# Patient Record
Sex: Male | Born: 1970 | Race: Black or African American | Hispanic: No | Marital: Single | Smoking: Current every day smoker
Health system: Southern US, Community
[De-identification: ages and names within clinical notes are randomized; demographics above are authoritative.]

## PROBLEM LIST (undated history)

## (undated) DIAGNOSIS — I10 Essential (primary) hypertension: Secondary | ICD-10-CM

---

## 2006-08-09 ENCOUNTER — Emergency Department: Payer: Self-pay | Admitting: Emergency Medicine

## 2013-05-02 ENCOUNTER — Inpatient Hospital Stay: Payer: Self-pay | Admitting: Internal Medicine

## 2013-05-02 LAB — CBC WITH DIFFERENTIAL/PLATELET
Basophil #: 0.1 10*3/uL (ref 0.0–0.1)
Basophil %: 0.8 %
Eosinophil #: 0 10*3/uL (ref 0.0–0.7)
Eosinophil %: 0 %
HCT: 41.6 % (ref 40.0–52.0)
HGB: 14.3 g/dL (ref 13.0–18.0)
Lymphocyte #: 0.5 10*3/uL — ABNORMAL LOW (ref 1.0–3.6)
Lymphocyte %: 2.6 %
MCH: 31.7 pg (ref 26.0–34.0)
MCHC: 34.3 g/dL (ref 32.0–36.0)
MCV: 93 fL (ref 80–100)
MONO ABS: 0.8 x10 3/mm (ref 0.2–1.0)
MONOS PCT: 4.2 %
Neutrophil #: 17.2 10*3/uL — ABNORMAL HIGH (ref 1.4–6.5)
Neutrophil %: 92.4 %
PLATELETS: 223 10*3/uL (ref 150–440)
RBC: 4.49 10*6/uL (ref 4.40–5.90)
RDW: 14.4 % (ref 11.5–14.5)
WBC: 18.6 10*3/uL — ABNORMAL HIGH (ref 3.8–10.6)

## 2013-05-02 LAB — URINALYSIS, COMPLETE
BILIRUBIN, UR: NEGATIVE
GLUCOSE, UR: NEGATIVE mg/dL (ref 0–75)
LEUKOCYTE ESTERASE: NEGATIVE
NITRITE: NEGATIVE
Ph: 5 (ref 4.5–8.0)
Protein: 100
RBC,UR: 3 /HPF (ref 0–5)
Specific Gravity: 1.027 (ref 1.003–1.030)

## 2013-05-02 LAB — COMPREHENSIVE METABOLIC PANEL
AST: 62 U/L — AB (ref 15–37)
Albumin: 3.6 g/dL (ref 3.4–5.0)
Alkaline Phosphatase: 143 U/L — ABNORMAL HIGH
Anion Gap: 7 (ref 7–16)
BUN: 13 mg/dL (ref 7–18)
Bilirubin,Total: 0.4 mg/dL (ref 0.2–1.0)
Calcium, Total: 9.6 mg/dL (ref 8.5–10.1)
Chloride: 97 mmol/L — ABNORMAL LOW (ref 98–107)
Co2: 29 mmol/L (ref 21–32)
Creatinine: 1.31 mg/dL — ABNORMAL HIGH (ref 0.60–1.30)
EGFR (African American): >60
EGFR (Non-African Amer.): >60
GLUCOSE: 116 mg/dL — AB (ref 65–99)
OSMOLALITY: 267 (ref 275–301)
POTASSIUM: 3.8 mmol/L (ref 3.5–5.1)
SGPT (ALT): 42 U/L (ref 12–78)
Sodium: 133 mmol/L — ABNORMAL LOW (ref 136–145)
TOTAL PROTEIN: 8.4 g/dL — AB (ref 6.4–8.2)

## 2013-05-02 LAB — TROPONIN I: Troponin-I: <0.02 ng/mL

## 2013-05-02 LAB — RAPID INFLUENZA A&B ANTIGENS

## 2013-05-03 LAB — CBC WITH DIFFERENTIAL/PLATELET
Basophil #: 0.1 10*3/uL (ref 0.0–0.1)
Basophil %: 0.5 %
Eosinophil #: 0 10*3/uL (ref 0.0–0.7)
Eosinophil %: 0 %
HCT: 37.8 % — AB (ref 40.0–52.0)
HGB: 13 g/dL (ref 13.0–18.0)
LYMPHS PCT: 4.7 %
Lymphocyte #: 0.6 10*3/uL — ABNORMAL LOW (ref 1.0–3.6)
MCH: 32.1 pg (ref 26.0–34.0)
MCHC: 34.3 g/dL (ref 32.0–36.0)
MCV: 93 fL (ref 80–100)
MONOS PCT: 3.9 %
Monocyte #: 0.5 x10 3/mm (ref 0.2–1.0)
NEUTROS ABS: 12.5 10*3/uL — AB (ref 1.4–6.5)
Neutrophil %: 90.9 %
PLATELETS: 221 10*3/uL (ref 150–440)
RBC: 4.05 10*6/uL — AB (ref 4.40–5.90)
RDW: 14.7 % — AB (ref 11.5–14.5)
WBC: 13.8 10*3/uL — ABNORMAL HIGH (ref 3.8–10.6)

## 2013-05-03 LAB — BASIC METABOLIC PANEL
Anion Gap: 7 (ref 7–16)
BUN: 13 mg/dL (ref 7–18)
CALCIUM: 8.4 mg/dL — AB (ref 8.5–10.1)
CO2: 24 mmol/L (ref 21–32)
Chloride: 104 mmol/L (ref 98–107)
Creatinine: 1.12 mg/dL (ref 0.60–1.30)
EGFR (Non-African Amer.): >60
Glucose: 130 mg/dL — ABNORMAL HIGH (ref 65–99)
OSMOLALITY: 272 (ref 275–301)
Potassium: 3.5 mmol/L (ref 3.5–5.1)
Sodium: 135 mmol/L — ABNORMAL LOW (ref 136–145)

## 2013-05-03 LAB — TROPONIN I: Troponin-I: <0.02 ng/mL

## 2013-05-03 LAB — CK-MB: CK-MB: 16.6 ng/mL — ABNORMAL HIGH (ref 0.5–3.6)

## 2013-05-07 LAB — CULTURE, BLOOD (SINGLE)

## 2013-05-08 LAB — CULTURE, BLOOD (SINGLE)

## 2013-08-10 ENCOUNTER — Observation Stay: Payer: Self-pay | Admitting: Surgery

## 2013-08-10 LAB — CBC WITH DIFFERENTIAL/PLATELET
BASOS ABS: 0.1 10*3/uL (ref 0.0–0.1)
Basophil %: 0.4 %
Eosinophil #: 0.1 10*3/uL (ref 0.0–0.7)
Eosinophil %: 0.6 %
HCT: 43.9 % (ref 40.0–52.0)
HGB: 14.7 g/dL (ref 13.0–18.0)
Lymphocyte #: 1.3 10*3/uL (ref 1.0–3.6)
Lymphocyte %: 5.7 %
MCH: 31 pg (ref 26.0–34.0)
MCHC: 33.4 g/dL (ref 32.0–36.0)
MCV: 93 fL (ref 80–100)
Monocyte #: 1.7 x10 3/mm — ABNORMAL HIGH (ref 0.2–1.0)
Monocyte %: 7.8 %
NEUTROS ABS: 19 10*3/uL — AB (ref 1.4–6.5)
Neutrophil %: 85.5 %
PLATELETS: 416 10*3/uL (ref 150–440)
RBC: 4.74 10*6/uL (ref 4.40–5.90)
RDW: 14.3 % (ref 11.5–14.5)
WBC: 22.2 10*3/uL — AB (ref 3.8–10.6)

## 2013-08-10 LAB — BASIC METABOLIC PANEL
Anion Gap: 7 (ref 7–16)
BUN: 7 mg/dL (ref 7–18)
CHLORIDE: 99 mmol/L (ref 98–107)
Calcium, Total: 9.7 mg/dL (ref 8.5–10.1)
Co2: 28 mmol/L (ref 21–32)
Creatinine: 1.03 mg/dL (ref 0.60–1.30)
EGFR (African American): >60
EGFR (Non-African Amer.): >60
GLUCOSE: 98 mg/dL (ref 65–99)
Osmolality: 266 (ref 275–301)
POTASSIUM: 3.4 mmol/L — AB (ref 3.5–5.1)
SODIUM: 134 mmol/L — AB (ref 136–145)

## 2013-08-15 LAB — WOUND CULTURE

## 2013-08-15 LAB — CULTURE, BLOOD (SINGLE)

## 2013-08-17 LAB — WOUND CULTURE

## 2014-06-02 NOTE — H&P (Signed)
PATIENT NAME:  Thomas Gonzales, Thomas Gonzales MR#:  [id number] DATE OF BIRTH:  [birth year]  DATE OF ADMISSION:  08/10/2013  PRIMARY CARE PHYSICIAN:  Dr. Toy Cookey.   CHIEF COMPLAINT:  Rectal pain.   BRIEF HISTORY OF PRESENT ILLNESS:  Thomas Gonzales is a 44 year old gentleman seen in the Emergency Room with a 12 to 18 hour history of pain around his rectum.  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  One of his employers had had similar problems with a rectal abscess in the past and suggested he get it checked out if his symptoms persisted.  They got worse over the course of the day, came to the Emergency Room tonight for further evaluation.  He was noted to have some induration around his rectum.  His white blood cell count was 22,000.  CT scan was performed which demonstrated a 6 x 6 abscess which appeared to be below the levator muscles.  The surgical service was consulted.   He denies any similar or previous problems.  Denies any previous stomach problems.  His only previous surgery was right inguinal hernia repair at age 61 in an open fashion.  He has history of hypertension and asthma.  He was admitted in March of 2015 by the medical service for his symptomatic asthma, was treated with a steroid taper.  He is a cigarette smoker and continues to smoke.  He also has a history of hypertension on single drug therapy.  He has no cardiac disease or diabetes.  Denies history of hepatitis, yellow jaundice, pancreatitis, peptic ulcer disease, gallbladder disease or diverticulitis.  He is an intermittent drinker, primarily on weekends.  He is accompanied by his mother.    ALLERGIES:  He has no medical allergies.   CURRENT MEDICATIONS:  Include albuterol inhaler and amlodipine 5 mg once a day.   REVIEW OF SYSTEMS:  Otherwise unremarkable.  A 10-point review of systems was carried out with the patient and is otherwise negative.   PHYSICAL EXAMINATION: GENERAL:  He  is an alert, pleasant, comfortable, polite gentleman in no significant distress.  VITALS:  Blood pressure 160/80.  Heart rate is 92 and regular.  He is febrile at 99.  HEENT:  Unremarkable.  He has no scleral icterus.  No pupillary abnormalities.  NECK:  Supple, nontender with midline trachea.  CHEST:  Has some bilateral expiratory wheezes and some tubular sounds bilaterally.  The tubular sounds do clear with coughing.  He does appear a bit tight on examination.  CARDIAC:  Reveals no murmurs or gallops.  He seems to be in normal sinus rhythm.  ABDOMEN:  Benign with no abdominal discomfort, no point tenderness, no rebound, no guarding.  RECTAL:  Indurated area on the right side.  I did not do a digital examination.  The induration extends from posterior on his buttocks up to just below his scrotum.  There is not an obvious fluctuant area.  EXTREMITIES:  Lower extremity exam reveals full range of motion.  No deformities.  PSYCHIATRIC:  Normal orientation, normal affect.   IMPRESSION:  I have independently reviewed his CT scan.  He does appear to have a multiloculated fluid collection consistent with an abscess in the perianal, sub-levator area.   We will plan to admit him to the hospital, rehydrate him, put him on antibiotics, keep him nothing by mouth and arrange for surgical intervention this morning.  This plan has been outlined to the patient in detail  and he is in agreement.    ____________________________ Carmie Endalph L. Ely III, MD rle:ea D: 08/10/2013 03:52:28 ET T: 08/10/2013 05:33:16 ET JOB#: [id number]  cc: Carmie Endalph L. Ely III, MD, <Dictator> Serita ShellerErnest B. Maryellen PileEason, MD Quentin OreALPH L ELY MD ELECTRONICALLY SIGNED 08/10/2013 20:33

## 2014-06-02 NOTE — Discharge Summary (Signed)
PATIENT NAME:  Thomas Gonzales, Godson MR#:  [id number] DATE OF BIRTH:  09-24-1970  DATE OF ADMISSION:  05/02/2013 DATE OF DISCHARGE:  05/04/2013  ADMITTING PHYSICIAN: Dr. Heron NayVasireddy.  DISCHARGING PHYSICIAN: Enid Baasadhika Vipul Cafarelli, MD.  PRIMARY CARE PHYSICIAN: Dr. Maryellen PileEason.   CONSULTATIONS IN HOSPITAL: None.   DISCHARGE DIAGNOSES:  1.  Acute asthma exacerbation.  2.  Bilateral pneumonia.  3.  Influenza positive.  4.  Pleuritic chest pain secondary to pneumonia.  5.  Hypertension.   DISCHARGE HOME MEDICATIONS:  1.  Norvasc 5 mg p.o. daily.  2.  Levaquin 750 mg p.o. daily for 7 more days.  3.  Combivent inhaler 1 puff four times a day.  4.  Prednisone taper over 6 days.  5.  Tamiflu 75 mg p.o. b.i.d. to finish off the course.   DISCHARGE DIET: Low-sodium diet.   DISCHARGE ACTIVITY: As tolerated.   FOLLOWUP INSTRUCTIONS: PCP followup in 1 to 2 weeks.   LABORATORIES AND IMAGING STUDIES PRIOR TO DISCHARGE: Chest x-ray showing patchy consolidation, medial right lung base and left perihilar regions. No pleural effusion.   Blood cultures remained negative.   WBC 13.8, hemoglobin 13.3, hematocrit 37.8, platelet count 221.   Sodium 135, potassium 3.5, chloride 104, bicarb 24, BUN 13, creatinine 1.12, glucose 130, and calcium of 8.4.   Influenza test was positive for influenza B antigen.   Urinalysis negative for any infection.   BRIEF HOSPITAL COURSE: Thomas Gonzales is a healthy 44 year old African American male with past medical history significant for asthma who presents with significant difficulty breathing, hypoxia, and cough. Noted to have bilateral pneumonia from chest x-ray with elevated white count and also positive influenza B antigen test.   Asthma exacerbation with bilateral pneumonias associated with influenza positivity. Started Tamiflu. Was given steroid nebulizer treatments and also on IV antibiotics. The patient had intense pleuritic chest pain initially and was spiking high-grade fevers in  the first 24 hours. All of this settled down and his oxygen requirements have improved. He was on 3 L oxygen initially and he was weaned down to room air and is breathing comfortably. His cough has improved significantly along with the wheezing. The patient feels good, ambulated, and his sats did not drop, and he feels ready to go home.   DISCHARGE MEDICATIONS: He is being discharged on inhalers, prednisone taper, antibiotics, and Tamiflu to finish up the course.   His course has been otherwise uneventful in the hospital, advised to quit smoking.   DISCHARGE CONDITION: Stable.   DISCHARGE DISPOSITION: Home.   TIME SPENT ON DISCHARGE: 40 minutes.   ____________________________  Enid Baasadhika Rosamary Boudreau, MD rk:np D: 05/04/2013 15:11:44 ET T: 05/04/2013 17:50:42 ET JOB#: [id number]  cc: Enid Baasadhika Tanique Matney, MD, <Dictator> Serita ShellerErnest B. Maryellen PileEason, MD  Enid BaasADHIKA Machele Deihl MD ELECTRONICALLY SIGNED 05/18/2013 13:50

## 2014-06-02 NOTE — Discharge Summary (Signed)
PATIENT NAME:  Thomas Gonzales, Thomas Gonzales MR#:  [id number] DATE OF BIRTH:  01-Oct-1970  DATE OF ADMISSION:  08/10/2013 DATE OF DISCHARGE:  08/11/2013  DIAGNOSES: Perirectal abscess, hypertension and asthma.   PROCEDURE: Incision and drainage of a perirectal abscess of the right buttock with drain placement.   HISTORY OF PRESENT ILLNESS AND HOSPITAL COURSE: This is a patient with a perirectal abscess. Dr. Michela PitcherEly admitted the patient from the Emergency Room. He was taken to the Operating Room where drainage was performed. Cultures are currently pending. Penrose drains are in place, a total of 2, and he is much improved, tolerating a regular diet. He will be discharged in stable condition with the drains in place to follow up in my office on Wednesday in [place]. He is instructed to call on Monday for an appointment. He is given Bactrim and Flagyl and oral analgesics, and follow-up in my office.    ____________________________ Adah Salvageichard E. Excell Seltzerooper, MD rec:ts D: 08/11/2013 09:30:01 ET T: 08/11/2013 19:26:33 ET JOB#: [id number]  cc: Adah Salvageichard E. Excell Seltzerooper, MD, <Dictator> Lattie HawICHARD E COOPER MD ELECTRONICALLY SIGNED 08/12/2013 9:11

## 2014-06-02 NOTE — H&P (Signed)
PATIENT NAME:  Thomas Gonzales, Thomas Gonzales MR#:  [id number] DATE OF BIRTH:  Jan 19, 1971  DATE OF ADMISSION:  05/02/2013  PRIMARY CARE PHYSICIAN: Dr. Toy CookeyErnest Eason.   REFERRING PHYSICIAN: Dr. Loleta Roseory Forbach.    CHIEF COMPLAINT: Cough, shortness of breath.   HISTORY OF PRESENT ILLNESS: Mr. Thomas Gonzales is a 44 year old male with history of asthma, presented to the Emergency Department with complaints of cough, shortness of breath for the last 2 days. It started on Sunday with a cough which progressed to productive sputum, subjective fevers. Has been coughing productive yellow sputum. Concerning this, came to the Emergency Department. Workup in the Emergency Department: Influenza B is positive. Chest x-ray did not show any obvious infiltrates. The patient is in moderate distress secondary to shortness of breath. He is able to speak full sentences. The patient usually takes inhalers with improvement in his symptoms; however, did not get any relief from multiple inhalers.   PAST MEDICAL HISTORY:  1. History of asthma.  2. Hypertension.   PAST SURGICAL HISTORY: Hernia repair.   ALLERGIES: No known drug allergies.   HOME MEDICATIONS: Inhalers.    SOCIAL HISTORY: Continues to smoke 1 pack a day. Drinks alcohol 1 pint over the weekend. Denies using any illicit drugs. Works in [employer].   FAMILY HISTORY: Diabetes mellitus. Grandmother had leukemia.   REVIEW OF SYSTEMS:  CONSTITUTIONAL: Generalized weakness.  EYES: No change in vision.  EARS, NOSE, THROAT: Has a cough throat, sore throat.  RESPIRATORY: Has cough, shortness of breath.  CARDIOVASCULAR: No chest pain, palpations.  GASTROINTESTINAL: No nausea, vomiting, abdominal pain.  GENITOURINARY: No dysuria or hematuria.  ENDOCRINE: No polyuria or polydipsia.  HEMATOLOGIC: No easy bruising or bleeding.  SKIN: No rash or lesions.  MUSCULOSKELETAL: Generalized body aches.  NEUROLOGIC: No  or numbness in any part of the body.   PHYSICAL EXAMINATION:  GENERAL:  This is a well-built, well-nourished, age-appropriate male lying down in the bed, in mild distress.  VITAL SIGNS: Temperature 100.3, pulse 117, blood pressure 138/67, respiratory rate of 30, oxygen saturation is 94% on 2 liters of oxygen.  HEENT: Head normocephalic, atraumatic. There is no scleral icterus. Conjunctivae normal. Pupils equal and react to light. Extraocular movements are intact. Mucous membranes moist. Mild erythema of the pharynx.  NECK: Supple. No lymphadenopathy. No JVD. No carotid bruit. No thyromegaly.  CHEST: Has no focal tenderness. Bilateral diffuse wheezing and rhonchi.  HEART: S1, S2, regular, tachycardia. No pedal edema. Pulses 2+.  ABDOMEN: Bowel sounds present. Soft, mild tenderness. No hepatosplenomegaly.  SKIN: No rash or lesions.  MUSCULOSKELETAL: Good range of motion in all of the extremities.  NEUROLOGIC: The patient is alert, oriented to place, person and time. Cranial nerves II through XII intact. Motor 5/5 in upper and lower extremities.   LABORATORIES: CBC: WBC of 18.6, hemoglobin 14.3, platelet count of 223.   CMP is completely within normal limits.   Troponin less than 0.02.   UA negative for nitrites and leukocyte esterase. Influenza B is positive. ABG: PH of 7.41, pCO2 of 39, PaO2 of 74.   ASSESSMENT AND PLAN: Mr. Thomas Gonzales is a 44 year old male with known history of asthma. Comes with asthma exacerbation. The patient also has history of smoking.  1. Asthma/chronic obstructive pulmonary disease exacerbation: Continue with breathing treatments, Solu-Medrol and antibiotics.  2. Pneumonia: Even though chest x-ray does not show any obvious infiltrates, very highly concerning about pneumonia, ill looking. Considering the patient's influenza B, will also add vancomycin to the regimen. Continue with Rocephin and Zithromax.  Will also start the patient on Tamiflu. The patient is within 72 hour window.  3. Tobacco use: Counseled with the patient regarding quitting.   4. Hypertension: Currently well controlled.  5. Sinus tachycardia: Secondary to viral illness. Continue with intravenous fluids and follow up. Treat the underlying asthma exacerbation.  6. Keep the patient on deep vein thrombosis prophylaxis with Lovenox.   TIME SPENT: 50 minutes.   ____________________________ Susa Griffins, MD pv:gb D: 05/02/2013 23:48:18 ET T: 05/03/2013 00:53:39 ET JOB#: [id number]  cc: Susa Griffins, MD, <Dictator> Serita Sheller. Maryellen Pile, MD Susa Griffins MD ELECTRONICALLY SIGNED 05/25/2013 0:17

## 2014-06-02 NOTE — Op Note (Signed)
PATIENT NAME:  Thomas Gonzales, Thomas Gonzales MR#:  [id number] DATE OF BIRTH:  1970/12/30  DATE OF PROCEDURE:  08/10/2013  PREOPERATIVE DIAGNOSIS: Perirectal abscess.   POSTOPERATIVE DIAGNOSIS:  Perirectal abscess.  PROCEDURE: Incision and drainage of perirectal abscess.   SURGEON: Dionne Miloichard Maceo Hernan, M.D.   ANESTHESIA: General with LMA.  ASSISTANT: Baird CancerIsaac Stappaspas.   INDICATIONS: This is a patient with progressive pain and swelling signifying a perirectal abscess. Preoperatively, we discussed rationale for surgery, the options of observation, risk of bleeding, infection, recurrence, open wound. This was all reviewed for him. He understood and agreed to proceed.   FINDINGS: Large abscess cavity, 2 incisions made with drains placed.   DESCRIPTION OF PROCEDURE: The patient was induced to general anesthesia. He was on IV antibiotics. He was placed in a high lithotomy position and then prepped and draped in a sterile fashion.   Exam under anesthesia was performed identifying an area of induration on the right buttock. An incision was made first anteriorly with some serous fluid, which was cultured, and then a second incision was made posterior to this where a large amount of purulence exuded.  These 2 incisions were connected and, after aspirating out all the purulence, 2 piece of Penrose drain were placed.  One connecting the two separate incisions and 1 laid deep into the large cavity. These were tied in with 3-0 nylon. Sterile dressings were placed including ABDs and burn panties. The patient tolerated the procedure well. There were no complications. He was taken to the recovery room in stable condition to be admitted for continued care.    ____________________________ Adah Salvageichard E. Excell Seltzerooper, MD rec:ts D: 08/10/2013 12:41:57 ET T: 08/10/2013 19:05:41 ET JOB#: [id number]  cc: Adah Salvageichard E. Excell Seltzerooper, MD, <Dictator> Lattie HawICHARD E Yvonne Stopher MD ELECTRONICALLY SIGNED 08/11/2013 10:43

## 2015-03-07 IMAGING — CR DG CHEST 1V PORT
1 series · 1 of 1 positions shown · non-contrast
Comparison: None.

CLINICAL DATA: Cough and fever.  Tachycardia

EXAM:
PORTABLE CHEST - 1 VIEW

[ap]
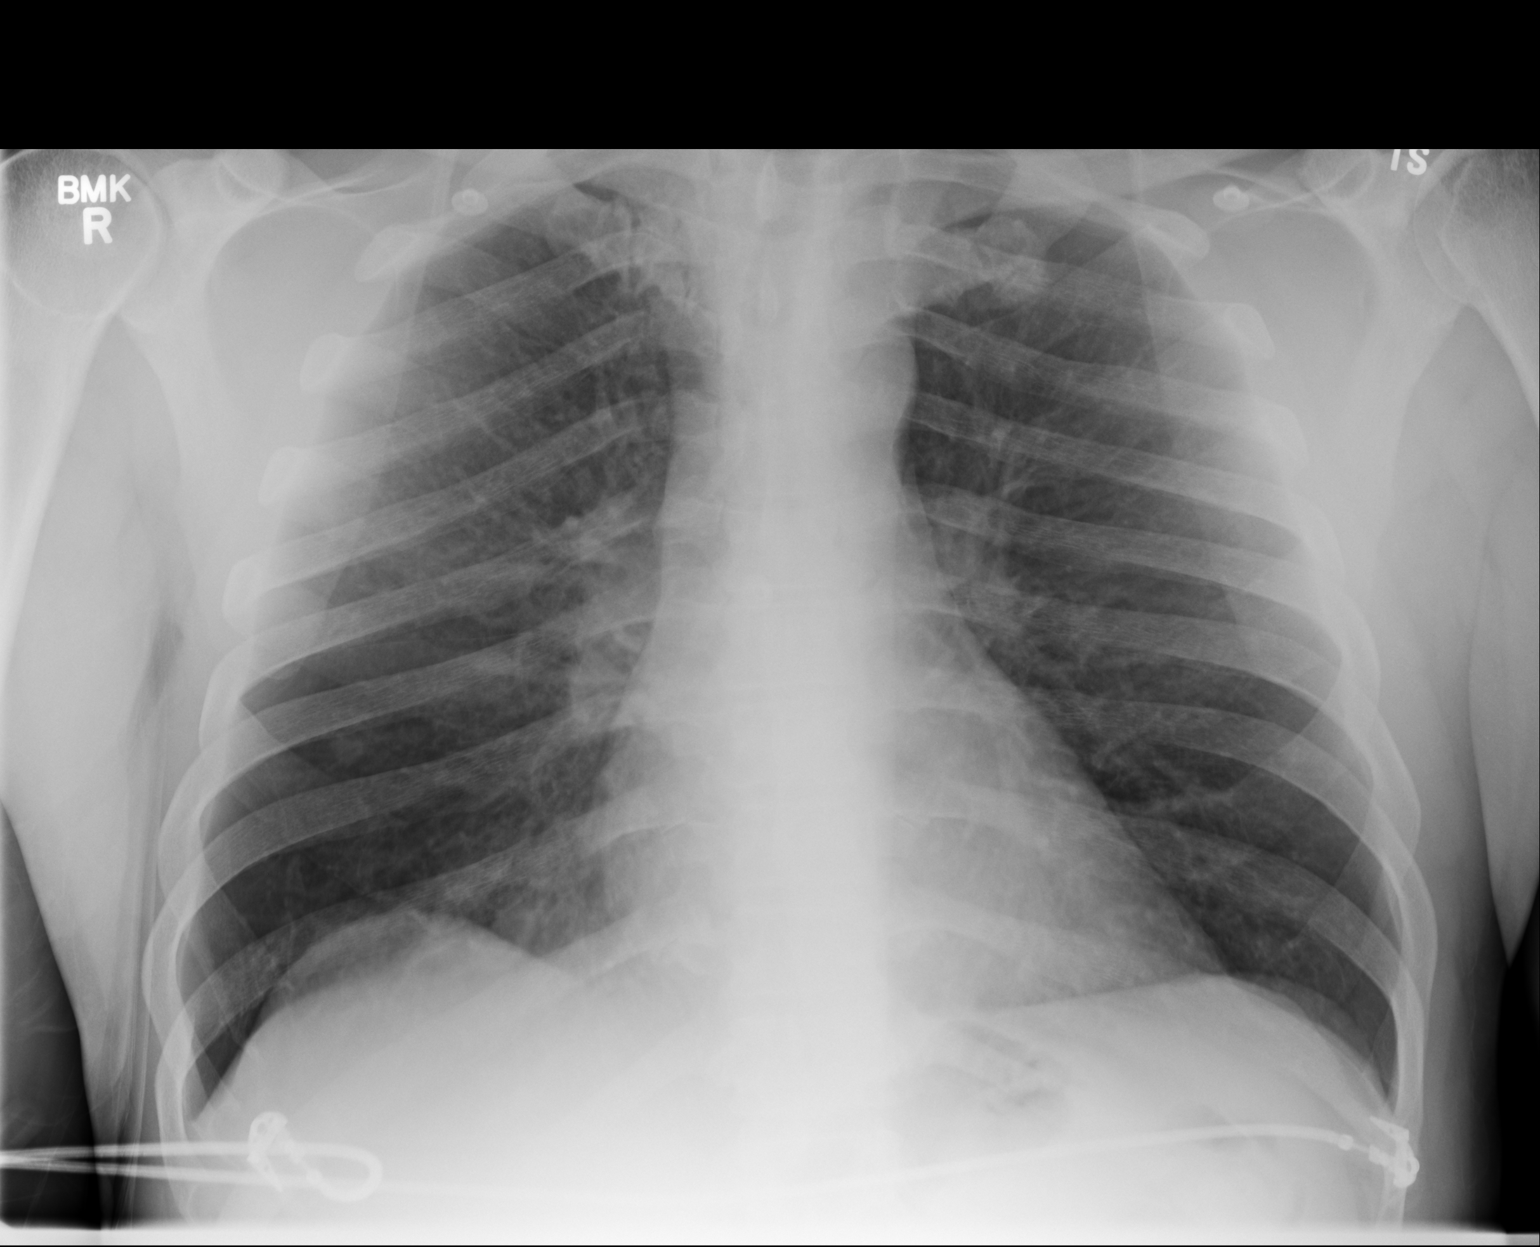

[1 of 1 positions shown; findings below may reference images not displayed]

FINDINGS: No edema or consolidation. No effusion or pneumothorax. Normal heart
size. No acute osseous findings.

There is a circumscribed, rounded 7 mm nodular density overlapping
the lower right chest. No symmetric opacity in left chest.
IMPRESSION: 1. No evidence of acute cardiopulmonary disease.
2. 7 mm nodular density at the right base could represent a
pulmonary nodule or nipple shadow. After resolution of the patient's
symptoms, recommend repeat PA and lateral chest x-ray with nipple
markers.

## 2015-03-09 IMAGING — CR DG CHEST 2V
1 series · 3 of 3 positions shown · non-contrast
Comparison: May 02, 2013

CLINICAL DATA: Followup pneumonia

EXAM:
CHEST  2 VIEW

[Series 1: w chest pa · 0.14mm/px · 3 of 3 slices shown]
[im 1/3]
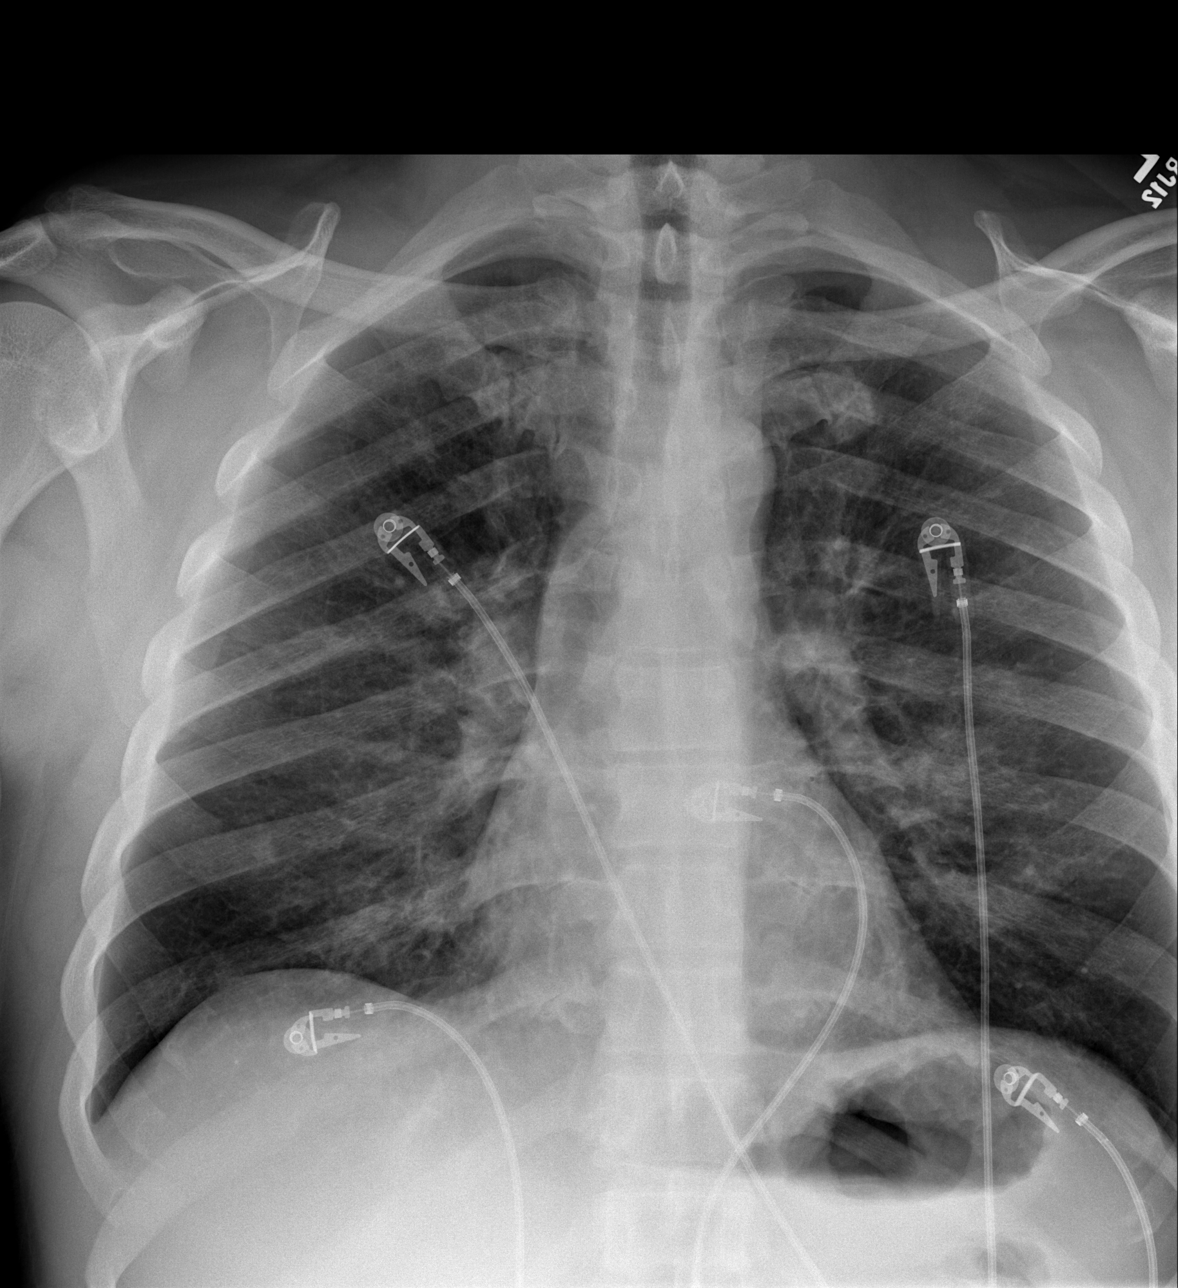
[im 2/3]
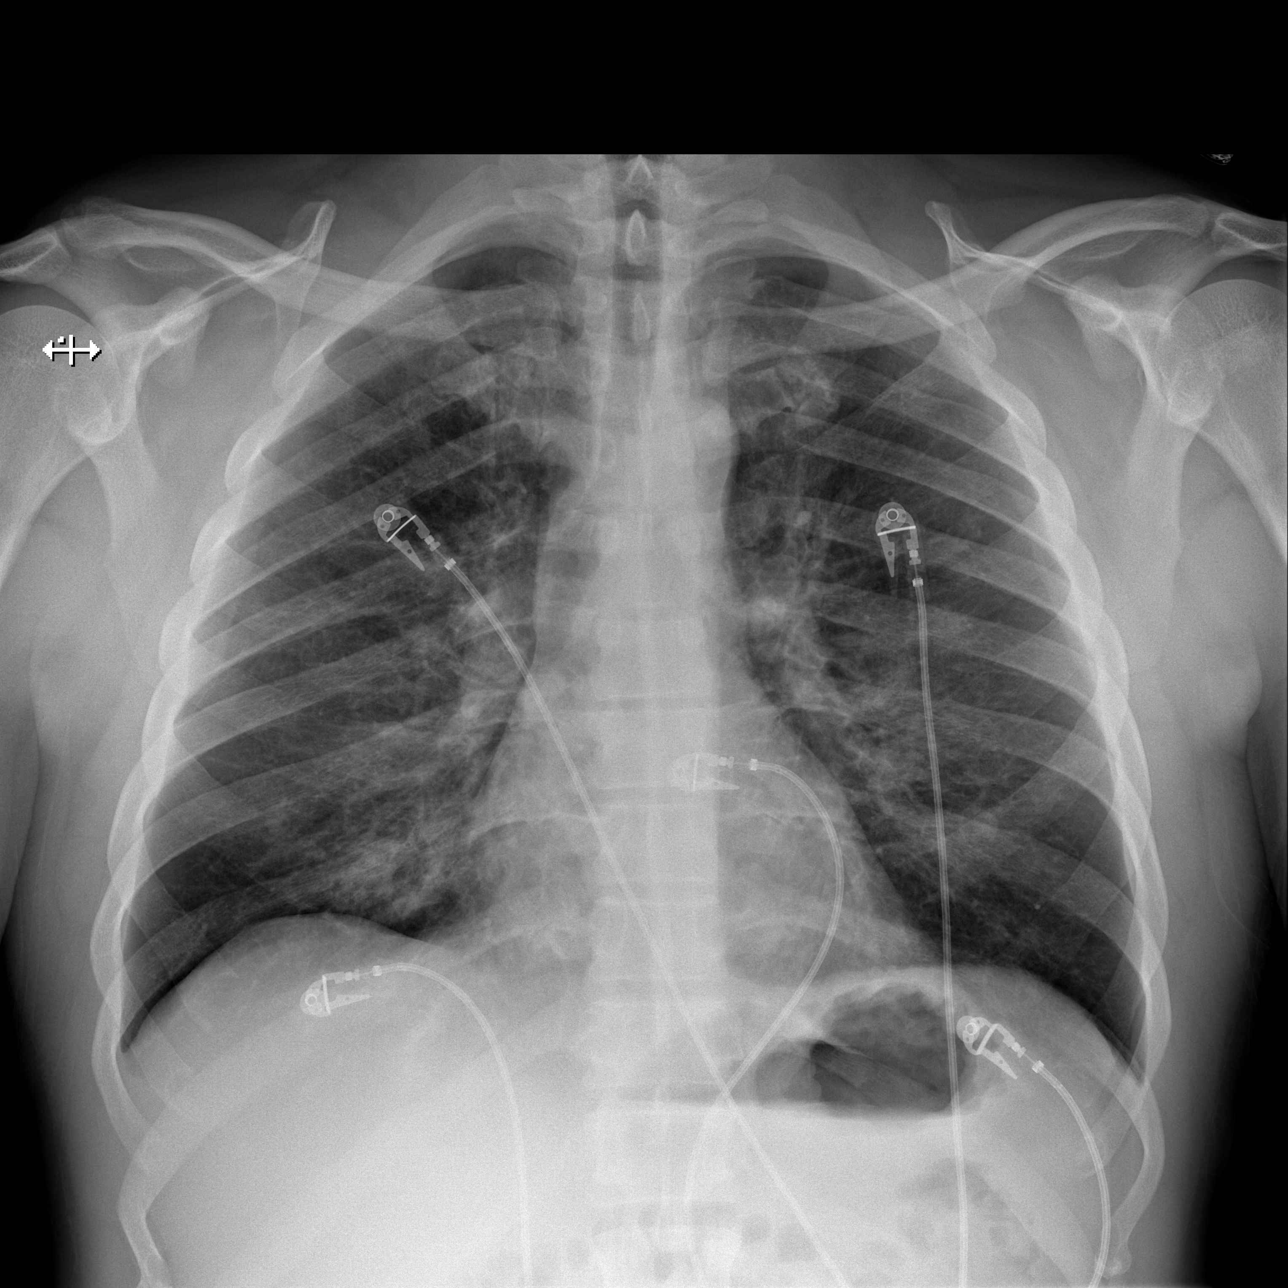
[im 3/3]
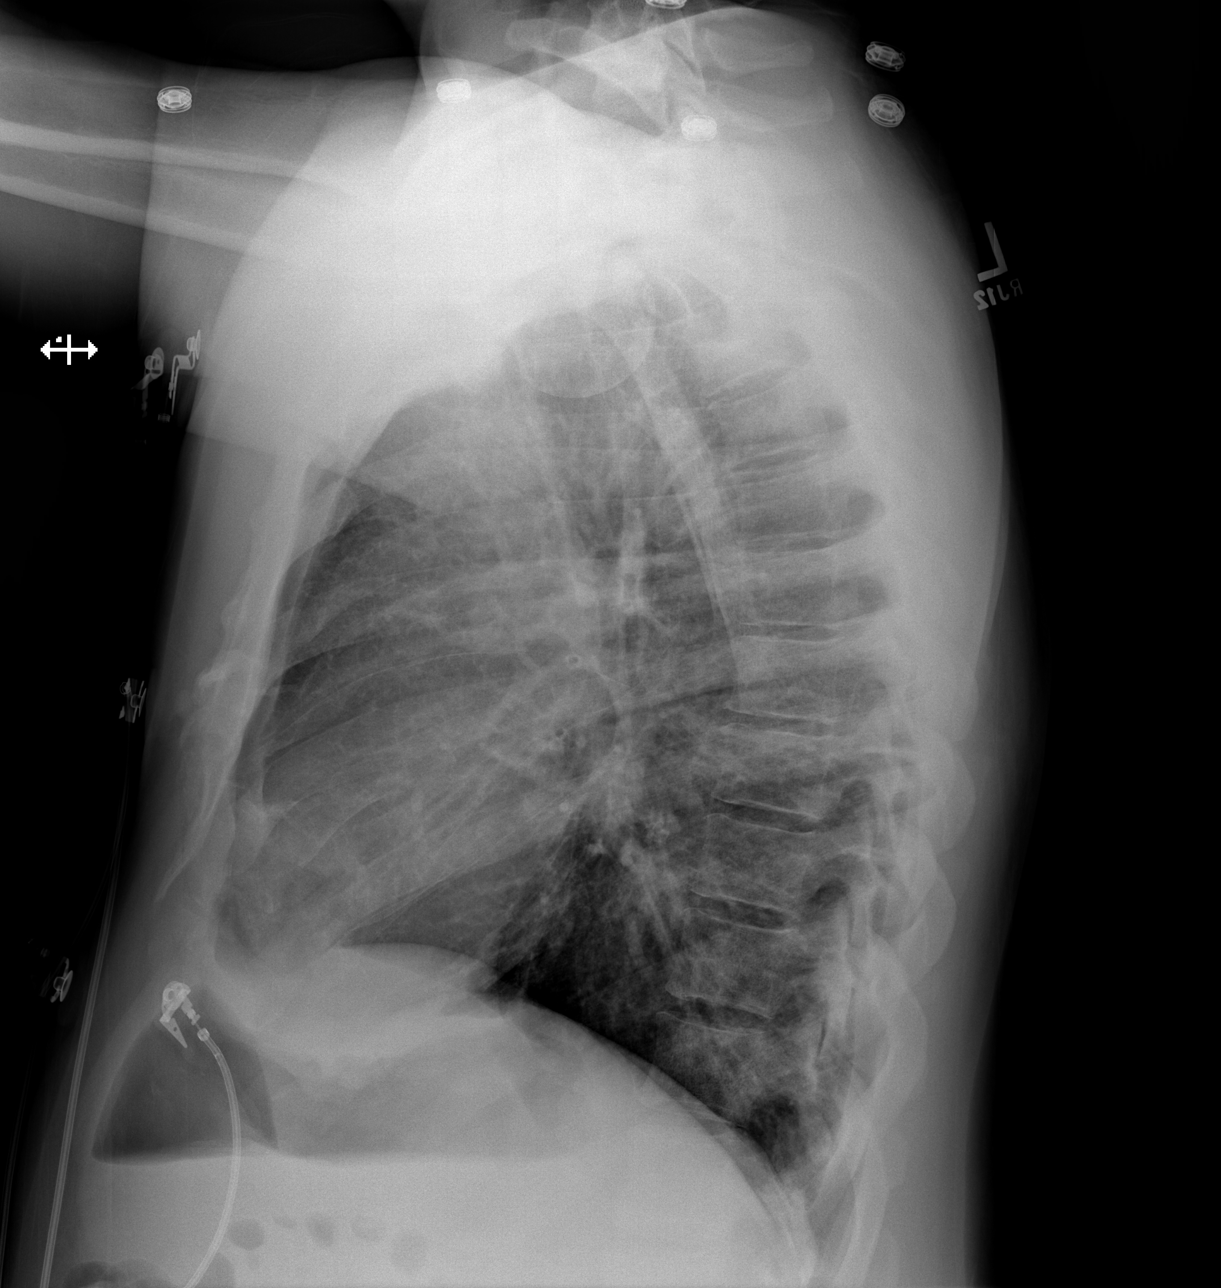

[3 of 3 positions shown; findings below may reference images not displayed]

FINDINGS: The heart size and mediastinal contours are within normal limits.
There is patchy consolidation of the medial right lung base and left
perihilar region. There is no pleural effusion. The previously noted
nodule in the right lung base is unchanged. The visualized skeletal
structures are stable.
IMPRESSION: Bilateral pneumonias.

## 2015-06-15 IMAGING — CT CT ABD-PELV W/ CM
2 of 5 series · 17 of 46 positions shown, 19 images · IV contrast (isovue)
Comparison: None.

CLINICAL DATA: Evaluate for perirectal abscess. Right buttocks
firmness.

EXAM:
CT ABDOMEN AND PELVIS WITH CONTRAST
TECHNIQUE: Multidetector CT imaging of the abdomen and pelvis was performed
using the standard protocol following bolus administration of
intravenous contrast.
CONTRAST:  100 cc Isovue 300 intravenous

[Series 2: routine abd pel with · axial · 0.78mm/px · z∈[-584,-79]mm · 14 of 113 slices shown, 16 images]
[im 6/113  soft-tissue]
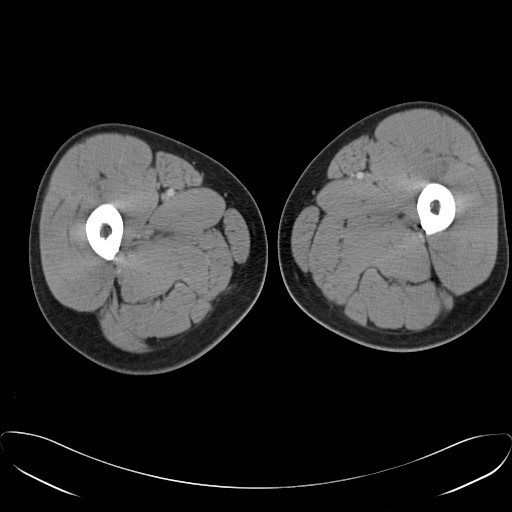
[im 6/113  bone]
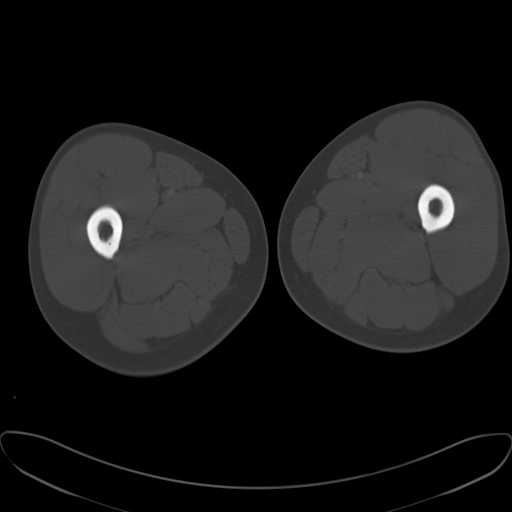
[im 17/113  soft-tissue]
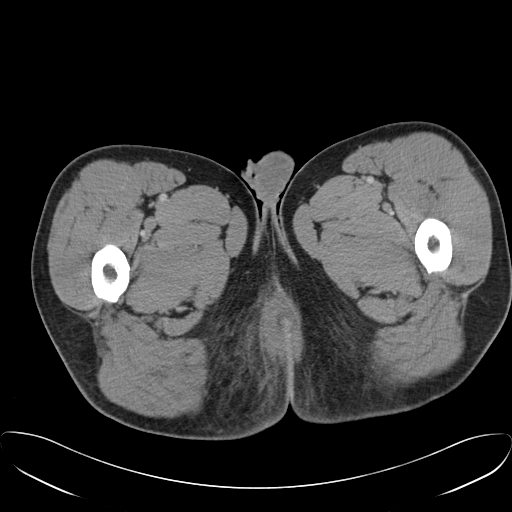
[im 23/113  soft-tissue]
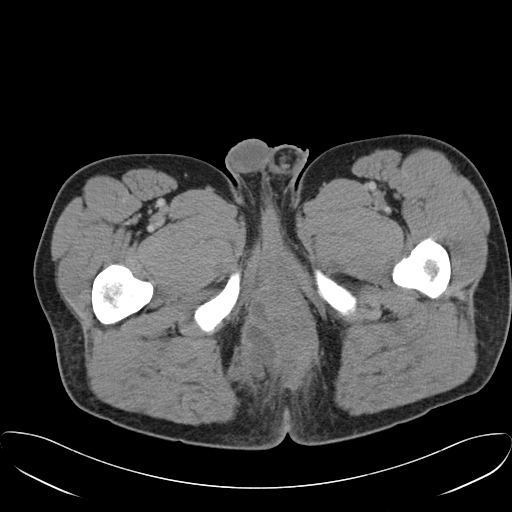
[im 29/113  soft-tissue]
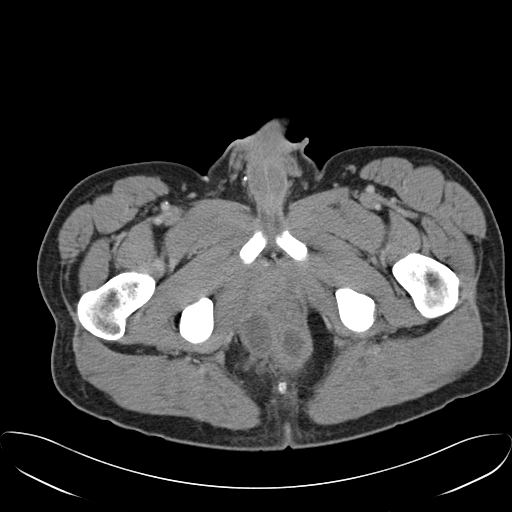
[im 40/113  soft-tissue]
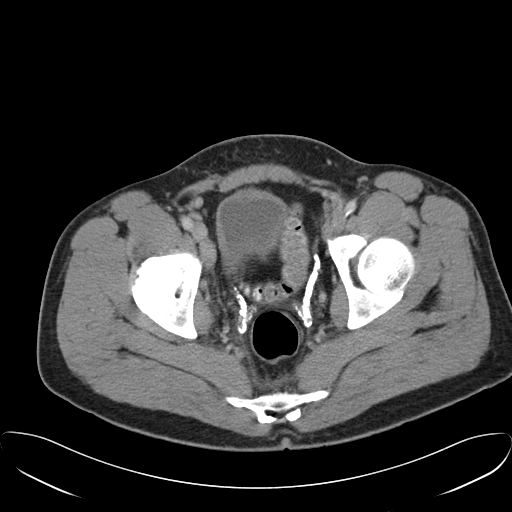
[im 45/113  soft-tissue]
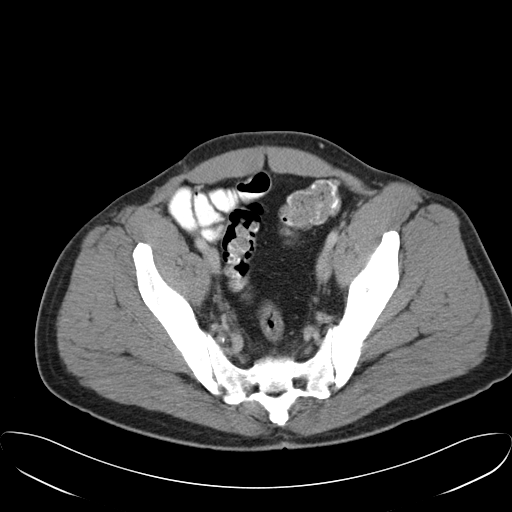
[im 51/113  soft-tissue]
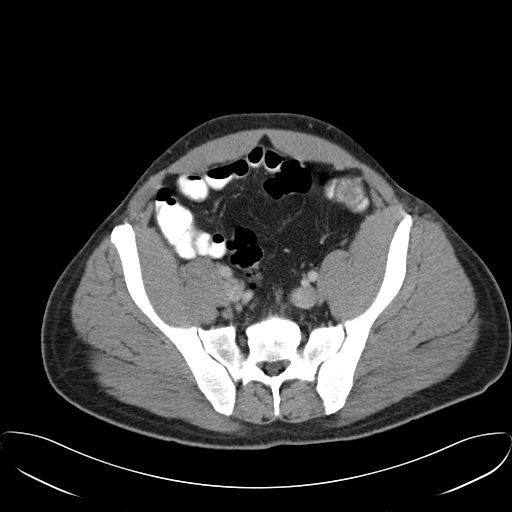
[im 62/113  soft-tissue]
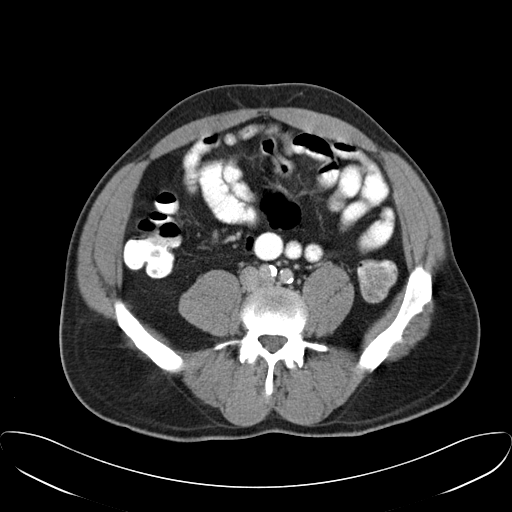
[im 68/113  soft-tissue]
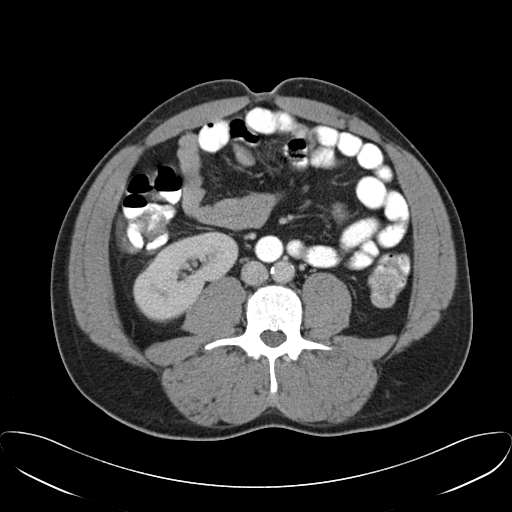
[im 68/113  bone]
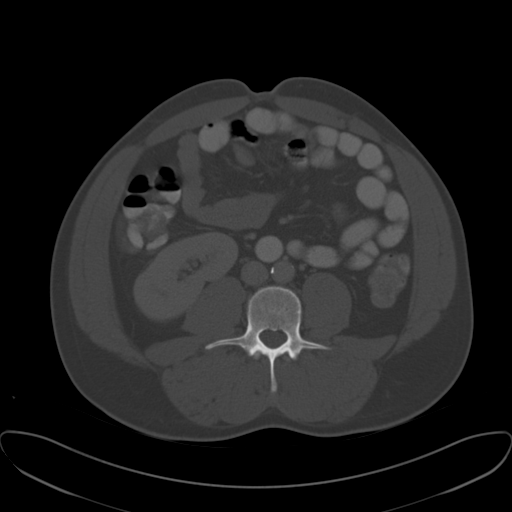
[im 73/113  soft-tissue]
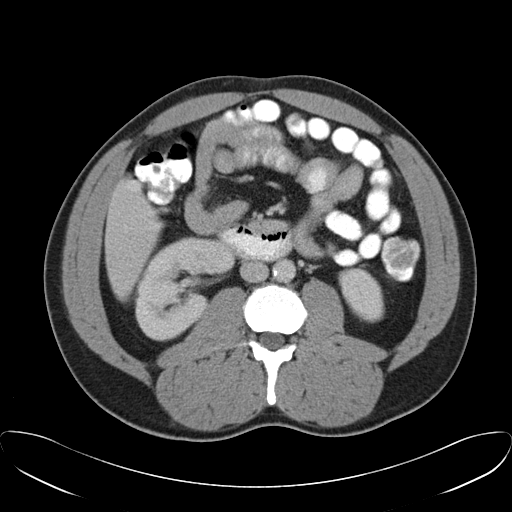
[im 85/113  soft-tissue]
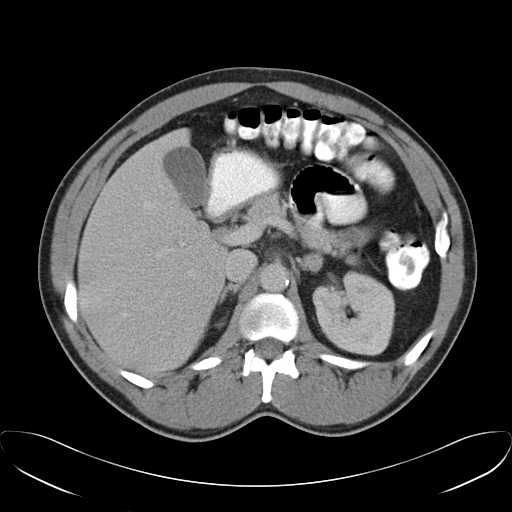
[im 90/113  soft-tissue]
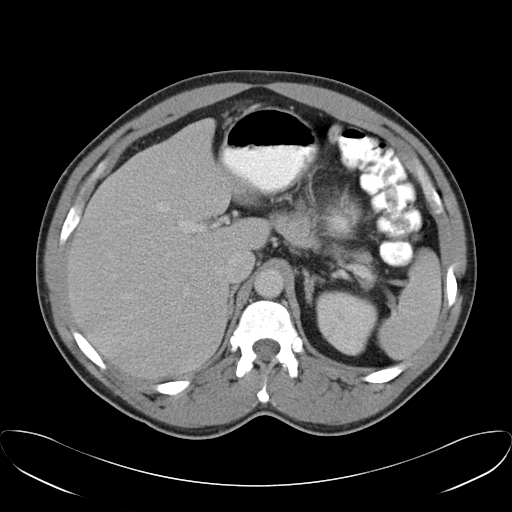
[im 96/113  soft-tissue]
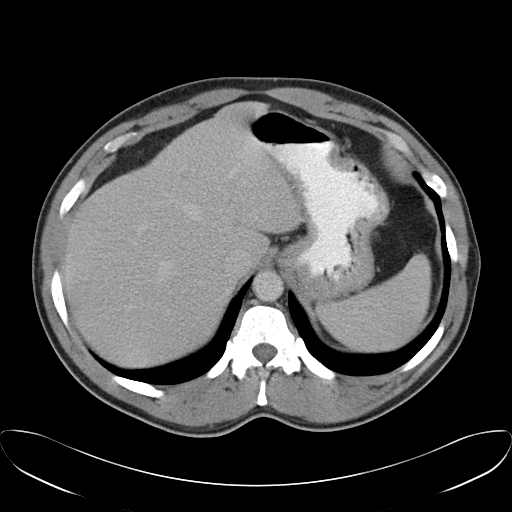
[im 107/113  soft-tissue]
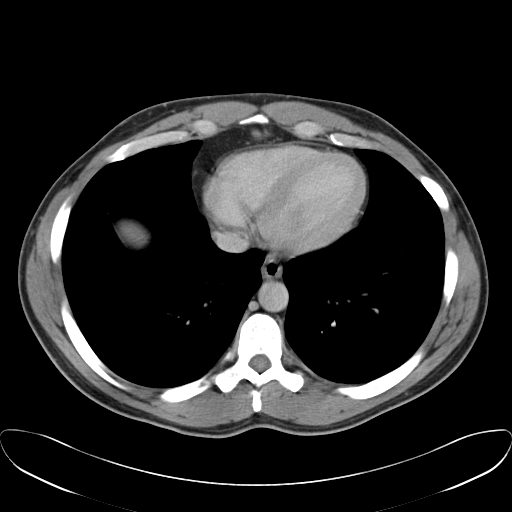

[Series 6: cor routine abd pel with · coronal · 1.13mm/px · 3 of 149 slices shown]
[im 50/149  soft-tissue]
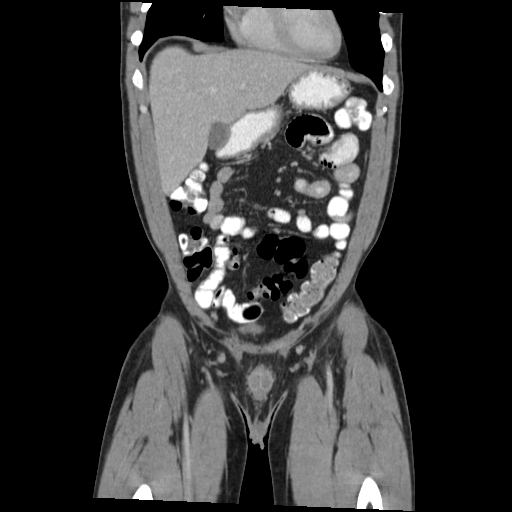
[im 66/149  soft-tissue]
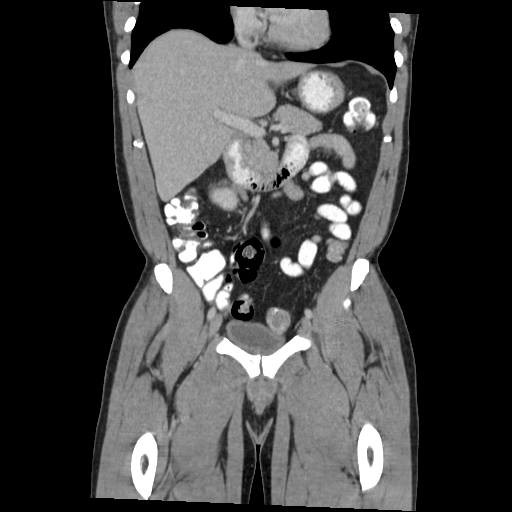
[im 83/149  soft-tissue]
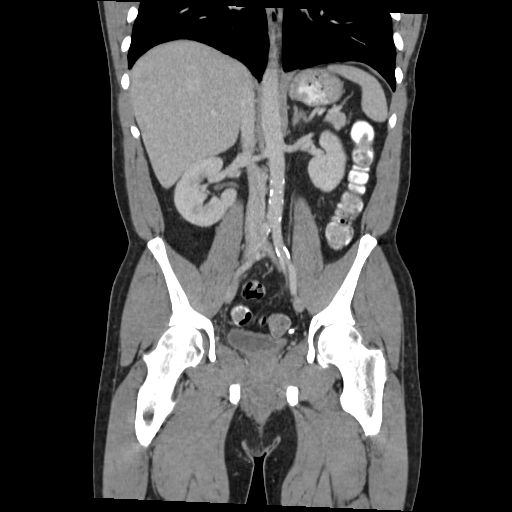

[17 of 46 positions shown; findings below may reference images not displayed]

FINDINGS: BODY WALL: Unremarkable.

LOWER CHEST: Unremarkable.

ABDOMEN/PELVIS:

Liver: No focal abnormality.

Biliary: No evidence of biliary obstruction or stone.

Pancreas: Unremarkable.

Spleen: Unremarkable.

Adrenals: 12 mm nodule in the left adrenal gland is indeterminate on
postcontrast imaging. Statistics favor adenoma.

Kidneys and ureters: No hydronephrosis or stone.

Bladder: Unremarkable.

Reproductive: Vas deferens calcifications, often seen with diabetes.

Bowel: There is a large fluid collection with peripheral enhancement
around the anus, extending from the [DATE] to [DATE] position. There
are multiple septations within the collection. The overall maximal
diameter measures 6 x 4.5 x 6 cm. The collection displaces the
rectum to the left. There is no abscess the levator. There is
presacral edema which is considered reactive. Normal appendix.

Retroperitoneum: No mass or adenopathy.

Peritoneum: No ascites or pneumoperitoneum.

Vascular: No acute abnormality.

OSSEOUS: Advanced degenerative disc narrowing at L5-S1, with
sclerosis and spurring.
IMPRESSION: 1. 6 x 5 x 6 cm multi-septated perianal abscess, as above. The
abscess has broad contact with the levator, but does not extend
above it.
2. 12 mm left adrenal nodule, statistically an adenoma.

## 2016-01-20 ENCOUNTER — Encounter: Payer: Self-pay | Admitting: Emergency Medicine

## 2016-01-20 ENCOUNTER — Emergency Department
Admission: EM | Admit: 2016-01-20 | Discharge: 2016-01-20 | Disposition: A | Discharge status: Home / Self Care | Payer: BLUE CROSS/BLUE SHIELD | Attending: Emergency Medicine | Admitting: Emergency Medicine

## 2016-01-20 DIAGNOSIS — L0231 Cutaneous abscess of buttock: Secondary | ICD-10-CM | POA: Diagnosis not present

## 2016-01-20 DIAGNOSIS — F17200 Nicotine dependence, unspecified, uncomplicated: Secondary | ICD-10-CM | POA: Diagnosis not present

## 2016-01-20 DIAGNOSIS — I10 Essential (primary) hypertension: Secondary | ICD-10-CM | POA: Insufficient documentation

## 2016-01-20 HISTORY — DX: Essential (primary) hypertension: I10

## 2016-01-20 MED ORDER — OXYCODONE-ACETAMINOPHEN 5-325 MG PO TABS
2.0000 | ORAL_TABLET | Freq: Once | ORAL | Status: AC
  Administered 2016-01-20: 2 via ORAL
  Filled 2016-01-20: qty 2

## 2016-01-20 MED ORDER — SULFAMETHOXAZOLE-TRIMETHOPRIM 800-160 MG PO TABS: 1.0000 | ORAL_TABLET | Freq: Two times a day (BID) | ORAL | 0 refills | Status: DC

## 2016-01-20 MED ORDER — PSEUDOEPH-BROMPHEN-DM 30-2-10 MG/5ML PO SYRP: 5.0000 mL | ORAL_SOLUTION | Freq: Four times a day (QID) | ORAL | 0 refills | Status: DC | PRN

## 2016-01-20 MED ORDER — OXYCODONE-ACETAMINOPHEN 7.5-325 MG PO TABS: 1.0000 | ORAL_TABLET | ORAL | 0 refills | Status: DC | PRN

## 2016-01-20 MED ORDER — LIDOCAINE HCL (PF) 1 % IJ SOLN
INTRAMUSCULAR | Status: AC
  Filled 2016-01-20: qty 10

## 2016-01-20 NOTE — ED Provider Notes (Signed)
[facility] Emergency Department Provider Note   ____________________________________________   First MD Initiated Contact with Patient 01/20/16 [phone]     (approximate)  I have reviewed the triage vital signs and the nursing notes.   HISTORY  Chief Complaint Abscess and URI    HPI Thomas Gonzales is a 45 y.o. male patient complain swollen and painful lesion right buttock for 4 days. Patient said prior history of pilonidal cyst was I&D in this facility 2 years ago. Patient also complaining of nasal congestion and cough for 1 week. No palliative measures for either complaint. Patient denies fever, chills, or nausea vomiting diarrhea with this complaints. Patient rates his pain as a 7/10. Patient describes pain as "sharp".  Past Medical History:  Diagnosis Date  . Hypertension     There are no active problems to display for this patient.   History reviewed. No pertinent surgical history.  Prior to Admission medications   Medication Sig Start Date End Date Taking? Authorizing Provider  brompheniramine-pseudoephedrine-DM 30-2-10 MG/5ML syrup Take 5 mLs by mouth 4 (four) times daily as needed. 01/20/16   Joni Reiningonald K Leina Babe, PA-C  oxyCODONE-acetaminophen (PERCOCET) 7.5-325 MG tablet Take 1 tablet by mouth every 4 (four) hours as needed for severe pain. 01/20/16   Joni Reiningonald K Cristofher Livecchi, PA-C  sulfamethoxazole-trimethoprim (BACTRIM DS,SEPTRA DS) 800-160 MG tablet Take 1 tablet by mouth 2 (two) times daily. 01/20/16   Joni Reiningonald K Anelisse Jacobson, PA-C    Allergies Patient has no known allergies.  No family history on file.  Social History Social History  Substance Use Topics  . Smoking status: Current Every Day Smoker  . Smokeless tobacco: Never Used  . Alcohol use No    Review of Systems Constitutional: No fever/chills Eyes: No visual changes. ENT: No sore throat. Cardiovascular: Denies chest pain. Respiratory: Denies shortness of breath. Gastrointestinal: No abdominal  pain.  No nausea, no vomiting.  No diarrhea.  No constipation. Genitourinary: Negative for dysuria. Musculoskeletal: Negative for back pain. Skin: Negative for rash. Redness and swelling to the buttocks Neurological: Negative for headaches, focal weakness or numbness. Endocrine:Hypertension  ____________________________________________   PHYSICAL EXAM:  VITAL SIGNS: ED Triage Vitals   Enc Vitals Group     BP      Pulse Rate (!) 102     Resp      Temp 98.9 F (37.2 C)     Temp Source Oral     SpO2 98 %     Weight 189 lb (85.7 kg)     Height 5\' 10"  (1.778 m)     Head Circumference      Peak Flow      Pain Score 7     Pain Loc      Pain Edu?      Excl. in GC?     Constitutional: Alert and oriented. Well appearing and in no acute distress. Eyes: Conjunctivae are normal. PERRL. EOMI. Head: Atraumatic. Nose: No congestion/rhinnorhea. Mouth/Throat: Mucous membranes are moist.  Oropharynx non-erythematous. Neck: No stridor.  No cervical spine tenderness to palpation. Hematological/Lymphatic/Immunilogical: No cervical lymphadenopathy. Cardiovascular: Normal rate, regular rhythm. Grossly normal heart sounds.  Good peripheral circulation. Respiratory: Normal respiratory effort.  No retractions. Lungs CTAB. Gastrointestinal: Soft and nontender. No distention. No abdominal bruits. No CVA tenderness. Musculoskeletal: No lower extremity tenderness nor edema.  No joint effusions. Neurologic:  Normal speech and language. No gross focal neurologic deficits are appreciated. No gait instability. Skin:  Skin is warm, dry and intact. No rash noted.  Psychiatric: Mood and affect are normal. Speech and behavior are normal.  ____________________________________________   LABS (all labs ordered are listed, but only abnormal results are displayed)  Labs Reviewed  AEROBIC/ANAEROBIC CULTURE (SURGICAL/DEEP WOUND)    ____________________________________________  EKG   ____________________________________________  RADIOLOGY   ____________________________________________   PROCEDURES  Procedure(s) performed:  INCISION AND DRAINAGE Performed by: Joni Reiningonald K Marlo Arriola Consent: Verbal consent obtained. Risks and benefits: risks, benefits and alternatives were discussed Type: abscess  Body area: A buttocks Anesthesia: local infiltration  Incision was made with a scalpel.  Local anesthetic: lidocaine 1% without epinephrine  Anesthetic total: 7 ml  Complexity: complex Blunt dissection to break up loculations  Drainage: purulent  Drainage amount: Large Packing material: 1/4 in iodoform gauze  Patient tolerance: Patient tolerated the procedure well with no immediate complications.  &  Procedures  Critical Care performed: No  ____________________________________________   INITIAL IMPRESSION / ASSESSMENT AND PLAN / ED COURSE  Pertinent labs & imaging results that were available during my care of the patient were reviewed by me and considered in my medical decision making (see chart for details).  Abscess right buttocks. Patient given discharge care instructions. Patient a prescription for Bactrim DS, Percocet, and from felt DM. Patient advised to follow-up 2 days for wound check.  Clinical Course      ____________________________________________   FINAL CLINICAL IMPRESSION(S) / ED DIAGNOSES  Final diagnoses:  Abscess of buttock, right      NEW MEDICATIONS STARTED DURING THIS VISIT:  New Prescriptions   BROMPHENIRAMINE-PSEUDOEPHEDRINE-DM 30-2-10 MG/5ML SYRUP    Take 5 mLs by mouth 4 (four) times daily as needed.   OXYCODONE-ACETAMINOPHEN (PERCOCET) 7.5-325 MG TABLET    Take 1 tablet by mouth every 4 (four) hours as needed for severe pain.   SULFAMETHOXAZOLE-TRIMETHOPRIM (BACTRIM DS,SEPTRA DS) 800-160 MG TABLET    Take 1 tablet by mouth 2 (two) times daily.     Note:   This document was prepared using Dragon voice recognition software and may include unintentional dictation errors.    Joni ReiningRonald K Sebastin Perlmutter, PA-C 01/20/16 1049    Sharman CheekPhillip Stafford, MD 01/21/16 [phone]

## 2016-01-22 ENCOUNTER — Encounter: Payer: Self-pay | Admitting: Emergency Medicine

## 2016-01-22 ENCOUNTER — Emergency Department
Admission: EM | Admit: 2016-01-22 | Discharge: 2016-01-22 | Disposition: A | Discharge status: Home / Self Care | Payer: BLUE CROSS/BLUE SHIELD | Attending: Emergency Medicine | Admitting: Emergency Medicine

## 2016-01-22 DIAGNOSIS — Z4800 Encounter for change or removal of nonsurgical wound dressing: Secondary | ICD-10-CM | POA: Insufficient documentation

## 2016-01-22 DIAGNOSIS — F17200 Nicotine dependence, unspecified, uncomplicated: Secondary | ICD-10-CM | POA: Diagnosis not present

## 2016-01-22 DIAGNOSIS — Z5189 Encounter for other specified aftercare: Secondary | ICD-10-CM

## 2016-01-22 DIAGNOSIS — I10 Essential (primary) hypertension: Secondary | ICD-10-CM | POA: Diagnosis not present

## 2016-01-22 DIAGNOSIS — Z79899 Other long term (current) drug therapy: Secondary | ICD-10-CM | POA: Insufficient documentation

## 2016-01-22 NOTE — ED Triage Notes (Signed)
Patient ambulatory to triage with steady gait, without difficulty or distress noted; pt seen 12/11 for I&D of right buttock abscess; st told to return for recheck; pt denies any c/o

## 2016-01-22 NOTE — ED Provider Notes (Signed)
[facility] Emergency Department Provider Note   ____________________________________________   First MD Initiated Contact with Patient 01/22/16 [phone]     (approximate)  I have reviewed the triage vital signs and the nursing notes.   HISTORY  Chief Complaint Wound Check    HPI Thomas Gonzales is a 45 y.o. male follow-up secondary to incision and drainage abscess of the right buttocks 2 days ago. Patient stated decreased pain and decreased edema status post procedure. Patient continues to have moderate amount of drainage.Patient rates his pain as a 3/10. Patient states decreased use for narcotic pain medication but continue to take antibiotics as directed.   Past Medical History:  Diagnosis Date  . Hypertension     There are no active problems to display for this patient.   History reviewed. No pertinent surgical history.  Prior to Admission medications   Medication Sig Start Date End Date Taking? Authorizing Provider  brompheniramine-pseudoephedrine-DM 30-2-10 MG/5ML syrup Take 5 mLs by mouth 4 (four) times daily as needed. 01/20/16   Joni Reiningonald K Nilan Iddings, PA-C  oxyCODONE-acetaminophen (PERCOCET) 7.5-325 MG tablet Take 1 tablet by mouth every 4 (four) hours as needed for severe pain. 01/20/16   Joni Reiningonald K Jonessa Triplett, PA-C  sulfamethoxazole-trimethoprim (BACTRIM DS,SEPTRA DS) 800-160 MG tablet Take 1 tablet by mouth 2 (two) times daily. 01/20/16   Joni Reiningonald K Shawnita Krizek, PA-C    Allergies Patient has no known allergies.  No family history on file.  Social History Social History  Substance Use Topics  . Smoking status: Current Every Day Smoker  . Smokeless tobacco: Never Used  . Alcohol use No    Review of Systems Constitutional: No fever/chills Eyes: No visual changes. ENT: No sore throat. Cardiovascular: Denies chest pain. Respiratory: Denies shortness of breath. Gastrointestinal: No abdominal pain.  No nausea, no vomiting.  No diarrhea.  No  constipation. Genitourinary: Negative for dysuria. Musculoskeletal: Negative for back pain. Skin: Negative for rash. Neurological: Negative for headaches, focal weakness or numbness. Endocrine:Hypertension  ____________________________________________   PHYSICAL EXAM:  VITAL SIGNS: ED Triage Vitals  Enc Vitals Group     BP 01/22/16 0658 (!) 160/83     Pulse Rate 01/22/16 0658 88     Resp 01/22/16 0658 18     Temp 01/22/16 0658 97.8 F (36.6 C)     Temp Source 01/22/16 0658 Oral     SpO2 01/22/16 0658 96 %     Weight 01/22/16 0655 185 lb (83.9 kg)     Height 01/22/16 0655 5\' 10"  (1.778 m)     Head Circumference --      Peak Flow --      Pain Score --      Pain Loc --      Pain Edu? --      Excl. in GC? --     Constitutional: Alert and oriented. Well appearing and in no acute distress. Eyes: Conjunctivae are normal. PERRL. EOMI. Head: Atraumatic. Nose: No congestion/rhinnorhea. Mouth/Throat: Mucous membranes are moist.  Oropharynx non-erythematous. Neck: No stridor. No cervical spine tenderness to palpation. Hematological/Lymphatic/Immunilogical: No cervical lymphadenopathy. Cardiovascular: Normal rate, regular rhythm. Grossly normal heart sounds.  Good peripheral circulation. Elevated blood pressure but patient does not take this medication this morning. Respiratory: Normal respiratory effort.  No retractions. Lungs CTAB. Gastrointestinal: Soft and nontender. No distention. No abdominal bruits. No CVA tenderness. Musculoskeletal: No lower extremity tenderness nor edema.  No joint effusions. Neurologic:  Normal speech and language. No gross focal neurologic deficits are appreciated. No  gait instability. Skin:  Skin is warm, dry and intact. No rash noted. Psychiatric: Mood and affect are normal. Speech and behavior are normal.  ____________________________________________   LABS (all labs ordered are listed, but only abnormal results are displayed)  Labs Reviewed -  No data to display ____________________________________________  EKG   ____________________________________________  RADIOLOGY   ____________________________________________   PROCEDURES  Procedure(s) performed: None  Procedures  Critical Care performed: No  ____________________________________________   INITIAL IMPRESSION / ASSESSMENT AND PLAN / ED COURSE  Pertinent labs & imaging results that were available during my care of the patient were reviewed by me and considered in my medical decision making (see chart for details).  Wound check for incision and drainage of abscess 2 days ago. Patient given discharge Instructions. Patient advised to continue antibiotics and complete. Patient given a work note for 2 days. Patient advised to follow-up if condition worsens.  Clinical Course    Removing the drain reveals mild discharge. Wound was irrigated with normal saline with clear return. Patient had dressing applied. ____________________________________________   FINAL CLINICAL IMPRESSION(S) / ED DIAGNOSES  Final diagnoses:  Wound check, abscess      NEW MEDICATIONS STARTED DURING THIS VISIT:  New Prescriptions   No medications on file     Note:  This document was prepared using Dragon voice recognition software and may include unintentional dictation errors.    Joni Reiningonald K Larkyn Greenberger, PA-C 01/22/16 [id number]    Jennye MoccasinBrian S Quigley, MD 01/22/16 [phone]

## 2016-01-26 LAB — AEROBIC/ANAEROBIC CULTURE (SURGICAL/DEEP WOUND): SPECIAL REQUESTS: NORMAL

## 2016-01-26 LAB — AEROBIC/ANAEROBIC CULTURE W GRAM STAIN (SURGICAL/DEEP WOUND): Culture: NORMAL

## 2020-07-03 ENCOUNTER — Emergency Department
Admission: EM | Admit: 2020-07-03 | Discharge: 2020-07-03 | Disposition: A | Discharge status: Home / Self Care | Payer: BC Managed Care – PPO | Attending: Emergency Medicine | Admitting: Emergency Medicine

## 2020-07-03 ENCOUNTER — Other Ambulatory Visit: Payer: Self-pay

## 2020-07-03 DIAGNOSIS — L0232 Furuncle of buttock: Secondary | ICD-10-CM | POA: Diagnosis present

## 2020-07-03 DIAGNOSIS — F17200 Nicotine dependence, unspecified, uncomplicated: Secondary | ICD-10-CM | POA: Diagnosis not present

## 2020-07-03 DIAGNOSIS — L0231 Cutaneous abscess of buttock: Secondary | ICD-10-CM | POA: Diagnosis not present

## 2020-07-03 DIAGNOSIS — I10 Essential (primary) hypertension: Secondary | ICD-10-CM | POA: Insufficient documentation

## 2020-07-03 MED ORDER — NAPROXEN 500 MG PO TABS: 500.0000 mg | ORAL_TABLET | Freq: Two times a day (BID) | ORAL | 2 refills | Status: AC

## 2020-07-03 MED ORDER — OXYCODONE-ACETAMINOPHEN 7.5-325 MG PO TABS: 1.0000 | ORAL_TABLET | Freq: Four times a day (QID) | ORAL | 0 refills | Status: DC | PRN

## 2020-07-03 MED ORDER — SULFAMETHOXAZOLE-TRIMETHOPRIM 800-160 MG PO TABS: 1.0000 | ORAL_TABLET | Freq: Two times a day (BID) | ORAL | 0 refills | Status: DC

## 2020-07-03 NOTE — ED Triage Notes (Signed)
Pt here with a boil on the right side of his buttocks that appeared yesterday. Pt denies fever or any other symptoms.

## 2020-07-03 NOTE — ED Provider Notes (Signed)
[facility] Emergency Department Provider Note   ____________________________________________   Event Date/Time   First MD Initiated Contact with Patient 07/03/20 1040     (approximate)  I have reviewed the triage vital signs and the nursing notes.   HISTORY  Chief Complaint Abscess    HPI Thomas Gonzales is a 50 y.o. male patient stated there is a boil on the right side of his buttocks that occurred yesterday.  Patient denies drainage or fever with complaint.  Rates pain as a 7/10.  Described pain as "sore".  No palliative measure for complaint.  History of pilonidal cyst.         Past Medical History:  Diagnosis Date  . Hypertension     There are no problems to display for this patient.   History reviewed. No pertinent surgical history.  Prior to Admission medications   Medication Sig Start Date End Date Taking? Authorizing Provider  naproxen (NAPROSYN) 500 MG tablet Take 1 tablet (500 mg total) by mouth 2 (two) times daily with a meal. 07/03/20 07/03/21 Yes Joni Reining, PA-C  oxyCODONE-acetaminophen (PERCOCET) 7.5-325 MG tablet Take 1 tablet by mouth every 6 (six) hours as needed. 07/03/20  Yes Joni Reining, PA-C  sulfamethoxazole-trimethoprim (BACTRIM DS) 800-160 MG tablet Take 1 tablet by mouth 2 (two) times daily. 07/03/20  Yes Joni Reining, PA-C  brompheniramine-pseudoephedrine-DM 30-2-10 MG/5ML syrup Take 5 mLs by mouth 4 (four) times daily as needed. 01/20/16   Joni Reining, PA-C  oxyCODONE-acetaminophen (PERCOCET) 7.5-325 MG tablet Take 1 tablet by mouth every 4 (four) hours as needed for severe pain. 01/20/16   Joni Reining, PA-C  sulfamethoxazole-trimethoprim (BACTRIM DS,SEPTRA DS) 800-160 MG tablet Take 1 tablet by mouth 2 (two) times daily. 01/20/16   Joni Reining, PA-C    Allergies Patient has no known allergies.  History reviewed. No pertinent family history.  Social History Social History   Tobacco Use  .  Smoking status: Current Every Day Smoker  . Smokeless tobacco: Never Used  Substance Use Topics  . Alcohol use: No    Review of Systems Constitutional: No fever/chills Eyes: No visual changes. ENT: No sore throat. Cardiovascular: Denies chest pain. Respiratory: Denies shortness of breath. Gastrointestinal: No abdominal pain.  No nausea, no vomiting.  No diarrhea.  No constipation. Genitourinary: Negative for dysuria. Musculoskeletal: Negative for back pain. Skin: Negative for rash. Neurological: Negative for headaches, focal weakness or numbness. Endocrine:  Hypertension  ____________________________________________   PHYSICAL EXAM:  VITAL SIGNS: ED Triage Vitals  Enc Vitals Group     BP 07/03/20 1031 (!) 162/75     Pulse Rate 07/03/20 1031 100     Resp 07/03/20 1031 18     Temp 07/03/20 1031 98.9 F (37.2 C)     Temp Source 07/03/20 1031 Oral     SpO2 07/03/20 1031 97 %     Weight 07/03/20 1033 196 lb (88.9 kg)     Height 07/03/20 1033 5\' 9"  (1.753 m)     Head Circumference --      Peak Flow --      Pain Score 07/03/20 1032 7     Pain Loc --      Pain Edu? --      Excl. in GC? --     Constitutional: Alert and oriented. Well appearing and in no acute distress. Eyes: Conjunctivae are normal. PERRL. EOMI. Head: Atraumatic. Nose: No congestion/rhinnorhea. Mouth/Throat: Mucous membranes are moist.  Oropharynx non-erythematous.  Neck: No stridor. Hematological/Lymphatic/Immunilogical: No cervical lymphadenopathy. Cardiovascular: Normal rate, regular rhythm. Grossly normal heart sounds.  Good peripheral circulation. Respiratory: Normal respiratory effort.  No retractions. Lungs CTAB. Gastrointestinal: Soft and nontender. No distention. No abdominal bruits. No CVA tenderness. Musculoskeletal: No lower extremity tenderness nor edema.  No joint effusions. Neurologic:  Normal speech and language. No gross focal neurologic deficits are appreciated. No gait  instability. Skin:  Skin is warm, dry and intact.  Nonfluctuant lesion right buttocks.   Psychiatric: Mood and affect are normal. Speech and behavior are normal.  ____________________________________________   LABS (all labs ordered are listed, but only abnormal results are displayed)  Labs Reviewed - No data to display ____________________________________________  EKG   ____________________________________________  RADIOLOGY I, Joni Reining, personally viewed and evaluated these images (plain radiographs) as part of my medical decision making, as well as reviewing the written report by the radiologist.  ED MD interpretation:    Official radiology report(s): No results found.  ____________________________________________   PROCEDURES  Procedure(s) performed (including Critical Care):  Procedures   ____________________________________________   INITIAL IMPRESSION / ASSESSMENT AND PLAN / ED COURSE  As part of my medical decision making, I reviewed the following data within the electronic MEDICAL RECORD NUMBER         Patient presents with pain and edema to the right buttocks.  Patient has a history of pilonidal cyst.  Discussed patient rationale for not incised and draining at this time.  Patient amenable to conservative treatment consist of antibiotics anti-inflammatories and pain medication.  Advised to follow-up as necessary.      ____________________________________________   FINAL CLINICAL IMPRESSION(S) / ED DIAGNOSES  Final diagnoses:  Abscess of buttock, right     ED Discharge Orders         Ordered    sulfamethoxazole-trimethoprim (BACTRIM DS) 800-160 MG tablet  2 times daily        07/03/20 1101    naproxen (NAPROSYN) 500 MG tablet  2 times daily with meals        07/03/20 1101    oxyCODONE-acetaminophen (PERCOCET) 7.5-325 MG tablet  Every 6 hours PRN        07/03/20 1101          *Please note:  Thomas Gonzales was evaluated in Emergency  Department on 07/03/2020 for the symptoms described in the history of present illness. He was evaluated in the context of the global COVID-19 pandemic, which necessitated consideration that the patient might be at risk for infection with the SARS-CoV-2 virus that causes COVID-19. Institutional protocols and algorithms that pertain to the evaluation of patients at risk for COVID-19 are in a state of rapid change based on information released by regulatory bodies including the CDC and federal and state organizations. These policies and algorithms were followed during the patient's care in the ED.  Some ED evaluations and interventions may be delayed as a result of limited staffing during and the pandemic.*   Note:  This document was prepared using Dragon voice recognition software and may include unintentional dictation errors.    Joni Reining, PA-C 07/03/20 1111    Delton Prairie, MD 07/03/20 [phone]

## 2020-07-03 NOTE — Discharge Instructions (Signed)
Read and follow discharge care instruction.  Advised to purchase a donut cushion due to your occupation.  Take medications as directed.

## 2020-07-05 ENCOUNTER — Emergency Department
Admission: EM | Admit: 2020-07-05 | Discharge: 2020-07-05 | Disposition: A | Discharge status: Home / Self Care | Payer: BC Managed Care – PPO | Attending: Emergency Medicine | Admitting: Emergency Medicine

## 2020-07-05 ENCOUNTER — Other Ambulatory Visit: Payer: Self-pay

## 2020-07-05 DIAGNOSIS — I10 Essential (primary) hypertension: Secondary | ICD-10-CM | POA: Insufficient documentation

## 2020-07-05 DIAGNOSIS — L0231 Cutaneous abscess of buttock: Secondary | ICD-10-CM | POA: Diagnosis not present

## 2020-07-05 DIAGNOSIS — F17200 Nicotine dependence, unspecified, uncomplicated: Secondary | ICD-10-CM | POA: Diagnosis not present

## 2020-07-05 DIAGNOSIS — R2241 Localized swelling, mass and lump, right lower limb: Secondary | ICD-10-CM | POA: Diagnosis present

## 2020-07-05 MED ORDER — LIDOCAINE-EPINEPHRINE 2 %-1:100000 IJ SOLN
20.0000 mL | Freq: Once | INTRAMUSCULAR | Status: AC
  Administered 2020-07-05: 20 mL
  Filled 2020-07-05: qty 1

## 2020-07-05 MED ORDER — CEPHALEXIN 500 MG PO CAPS: 500.0000 mg | ORAL_CAPSULE | Freq: Two times a day (BID) | ORAL | 0 refills | Status: AC

## 2020-07-05 NOTE — ED Triage Notes (Signed)
Pt in with co abscess to right buttocks, states started yesterday. Was here and put on antibiotics. Here for worsening symptoms.

## 2020-07-05 NOTE — ED Notes (Signed)
See triage note  Presents with abscess area to buttocks  States he was seen 2 days ago  Placed on antibiotics  States he has been using moist heat to area  Now area is larger  Afebrile on arrival

## 2020-07-05 NOTE — ED Provider Notes (Signed)
[facility] Emergency Department Provider Note   ____________________________________________   Event Date/Time   First MD Initiated Contact with Patient 07/05/20 [phone]     (approximate)  I have reviewed the triage vital signs and the nursing notes.   HISTORY  Chief Complaint Abscess    HPI Thomas Gonzales is a 50 y.o. male with past medical history of hypertension who presents to the ED complaining of abscess.  Patient reports that he has had approximately 3 days of increasing his pain and swelling to the area of his right buttock.  He was seen in the ED 2 days ago and diagnosed with abscess, prescribed Bactrim and did not have incision and drainage performed.  He presents today with increasing pain and swelling despite being compliant with antibiotic.  He denies any fevers, nausea, vomiting, constipation, diarrhea, or abdominal pain.  He has had prior abscess in similar location requiring drainage multiple years ago, has not had issues since then.        Past Medical History:  Diagnosis Date  . Hypertension     There are no problems to display for this patient.   No past surgical history on file.  Prior to Admission medications   Medication Sig Start Date End Date Taking? Authorizing Provider  cephALEXin (KEFLEX) 500 MG capsule Take 1 capsule (500 mg total) by mouth 2 (two) times daily for 7 days. 07/05/20 07/12/20 Yes Chesley Noon, MD  brompheniramine-pseudoephedrine-DM 30-2-10 MG/5ML syrup Take 5 mLs by mouth 4 (four) times daily as needed. 01/20/16   Joni Reining, PA-C  naproxen (NAPROSYN) 500 MG tablet Take 1 tablet (500 mg total) by mouth 2 (two) times daily with a meal. 07/03/20 07/03/21  Joni Reining, PA-C  oxyCODONE-acetaminophen (PERCOCET) 7.5-325 MG tablet Take 1 tablet by mouth every 4 (four) hours as needed for severe pain. 01/20/16   Joni Reining, PA-C  oxyCODONE-acetaminophen (PERCOCET) 7.5-325 MG tablet Take 1 tablet by mouth  every 6 (six) hours as needed. 07/03/20   Joni Reining, PA-C  sulfamethoxazole-trimethoprim (BACTRIM DS) 800-160 MG tablet Take 1 tablet by mouth 2 (two) times daily. 07/03/20   Joni Reining, PA-C  sulfamethoxazole-trimethoprim (BACTRIM DS,SEPTRA DS) 800-160 MG tablet Take 1 tablet by mouth 2 (two) times daily. 01/20/16   Joni Reining, PA-C    Allergies Patient has no known allergies.  No family history on file.  Social History Social History   Tobacco Use  . Smoking status: Current Every Day Smoker  . Smokeless tobacco: Never Used  Substance Use Topics  . Alcohol use: No    Review of Systems  Constitutional: No fever/chills Eyes: No visual changes. ENT: No sore throat. Cardiovascular: Denies chest pain. Respiratory: Denies shortness of breath. Gastrointestinal: No abdominal pain.  No nausea, no vomiting.  No diarrhea.  No constipation. Genitourinary: Negative for dysuria. Musculoskeletal: Negative for back pain. Skin: Negative for rash.  Positive for abscess. Neurological: Negative for headaches, focal weakness or numbness.  ____________________________________________   PHYSICAL EXAM:  VITAL SIGNS: ED Triage Vitals  Enc Vitals Group     BP 07/05/20 0219 (!) 153/74     Pulse Rate 07/05/20 0219 92     Resp 07/05/20 0219 20     Temp 07/05/20 0219 98.7 F (37.1 C)     Temp Source 07/05/20 0219 Oral     SpO2 07/05/20 0219 96 %     Weight 07/05/20 0218 197 lb (89.4 kg)     Height 07/05/20  9381 5\' 9"  (1.753 m)     Head Circumference --      Peak Flow --      Pain Score 07/05/20 0218 10     Pain Loc --      Pain Edu? --      Excl. in GC? --     Constitutional: Alert and oriented. Eyes: Conjunctivae are normal. Head: Atraumatic. Nose: No congestion/rhinnorhea. Mouth/Throat: Mucous membranes are moist. Neck: Normal ROM Cardiovascular: Normal rate, regular rhythm. Grossly normal heart sounds. Respiratory: Normal respiratory effort.  No retractions. Lungs  CTAB. Gastrointestinal: Soft and nontender. No distention.  Induration and erythema noted to medial portion of right buttock with central fluctuance but no drainage. Genitourinary: deferred Musculoskeletal: No lower extremity tenderness nor edema. Neurologic:  Normal speech and language. No gross focal neurologic deficits are appreciated. Skin:  Skin is warm, dry and intact. No rash noted. Psychiatric: Mood and affect are normal. Speech and behavior are normal.  ____________________________________________   LABS (all labs ordered are listed, but only abnormal results are displayed)  Labs Reviewed - No data to display   PROCEDURES  Procedure(s) performed (including Critical Care):  05/29/22Marland KitchenIncision and Drainage  Date/Time: 07/05/2020 8:40 AM Performed by: 07/07/2020, MD Authorized by: Chesley Noon, MD   Consent:    Consent obtained:  Verbal   Consent given by:  Patient   Risks, benefits, and alternatives were discussed: yes     Risks discussed:  Bleeding, incomplete drainage, pain and damage to other organs   Alternatives discussed:  Delayed treatment and referral Universal protocol:    Patient identity confirmed:  Verbally with patient and arm band Location:    Type:  Pilonidal cyst   Location:  Anogenital   Anogenital location:  Gluteal cleft Sedation:    Sedation type:  None Anesthesia:    Anesthesia method:  Local infiltration   Local anesthetic:  Lidocaine 2% WITH epi Procedure type:    Complexity:  Simple Procedure details:    Ultrasound guidance: yes     Needle aspiration: no     Incision types:  Elliptical   Wound management:  Probed and deloculated and irrigated with saline   Drainage:  Bloody and purulent   Drainage amount:  Scant   Wound treatment:  Wound left open   Packing materials:  None Post-procedure details:    Procedure completion:  Tolerated well, no immediate complications     ____________________________________________   INITIAL  IMPRESSION / ASSESSMENT AND PLAN / ED COURSE       50 year old male with past medical history of hypertension presents to the ED with increasing pain and swelling to his right buttock and gluteal cleft for the past 3 days, seen in the ED 2 days ago and prescribed Bactrim with no significant relief.  Patient with significant apparent cellulitis to his right buttock and gluteal cleft, small abscess pocket was identified on ultrasound.  Appears consistent with pilonidal cyst/abscess.  Incision and drainage was attempted with small amount of purulent drainage.  We will add Keflex to patient's antibiotic regimen, he has pain medication available at home.  He was provided with referral to general surgery for follow-up and counseled to return to the ED for new worsening symptoms.  Patient agrees with plan.      ____________________________________________   FINAL CLINICAL IMPRESSION(S) / ED DIAGNOSES  Final diagnoses:  Abscess of buttock, right     ED Discharge Orders         Ordered  cephALEXin (KEFLEX) 500 MG capsule  2 times daily        07/05/20 1031           Note:  This document was prepared using Dragon voice recognition software and may include unintentional dictation errors.   Chesley Noon, MD 07/05/20 [phone]

## 2020-09-27 ENCOUNTER — Other Ambulatory Visit: Payer: Self-pay | Admitting: Physician Assistant

## 2024-03-08 ENCOUNTER — Emergency Department

## 2024-03-08 ENCOUNTER — Other Ambulatory Visit: Payer: Self-pay

## 2024-03-08 ENCOUNTER — Observation Stay
Admission: EM | Admit: 2024-03-08 | Discharge: 2024-03-09 | Disposition: A | Discharge status: Still In Hospital | Attending: Hospitalist | Admitting: Hospitalist

## 2024-03-08 DIAGNOSIS — Z79899 Other long term (current) drug therapy: Secondary | ICD-10-CM | POA: Insufficient documentation

## 2024-03-08 DIAGNOSIS — R059 Cough, unspecified: Secondary | ICD-10-CM | POA: Diagnosis present

## 2024-03-08 DIAGNOSIS — I10 Essential (primary) hypertension: Secondary | ICD-10-CM | POA: Insufficient documentation

## 2024-03-08 DIAGNOSIS — U071 COVID-19: Principal | ICD-10-CM | POA: Insufficient documentation

## 2024-03-08 DIAGNOSIS — J45901 Unspecified asthma with (acute) exacerbation: Secondary | ICD-10-CM | POA: Diagnosis present

## 2024-03-08 DIAGNOSIS — J4542 Moderate persistent asthma with status asthmaticus: Secondary | ICD-10-CM | POA: Diagnosis not present

## 2024-03-08 DIAGNOSIS — F17200 Nicotine dependence, unspecified, uncomplicated: Secondary | ICD-10-CM | POA: Insufficient documentation

## 2024-03-08 DIAGNOSIS — K219 Gastro-esophageal reflux disease without esophagitis: Secondary | ICD-10-CM | POA: Diagnosis not present

## 2024-03-08 DIAGNOSIS — F17210 Nicotine dependence, cigarettes, uncomplicated: Secondary | ICD-10-CM | POA: Insufficient documentation

## 2024-03-08 LAB — TROPONIN T, HIGH SENSITIVITY
Troponin T High Sensitivity: 12 ng/L (ref 0–19)
Troponin T High Sensitivity: 12 ng/L (ref 0–19)

## 2024-03-08 LAB — BASIC METABOLIC PANEL WITH GFR
Anion gap: 12 (ref 5–15)
BUN: 15 mg/dL (ref 6–20)
CO2: 23 mmol/L (ref 22–32)
Calcium: 9.1 mg/dL (ref 8.9–10.3)
Chloride: 103 mmol/L (ref 98–111)
Creatinine, Ser: 0.84 mg/dL (ref 0.61–1.24)
GFR, Estimated: >60 mL/min
Glucose, Bld: 111 mg/dL — ABNORMAL HIGH (ref 70–99)
Potassium: 3.8 mmol/L (ref 3.5–5.1)
Sodium: 139 mmol/L (ref 135–145)

## 2024-03-08 LAB — CBC
HCT: 42.1 % (ref 39.0–52.0)
Hemoglobin: 14.4 g/dL (ref 13.0–17.0)
MCH: 31.5 pg (ref 26.0–34.0)
MCHC: 34.2 g/dL (ref 30.0–36.0)
MCV: 92.1 fL (ref 80.0–100.0)
Platelets: 325 10*3/uL (ref 150–400)
RBC: 4.57 MIL/uL (ref 4.22–5.81)
RDW: 14.1 % (ref 11.5–15.5)
WBC: 8 10*3/uL (ref 4.0–10.5)
nRBC: 0 % (ref 0.0–0.2)

## 2024-03-08 LAB — RESP PANEL BY RT-PCR (RSV, FLU A&B, COVID)  RVPGX2
Influenza A by PCR: NEGATIVE
Influenza B by PCR: NEGATIVE
Resp Syncytial Virus by PCR: NEGATIVE
SARS Coronavirus 2 by RT PCR: POSITIVE — AB

## 2024-03-08 MED ORDER — SODIUM CHLORIDE 0.9 % IV SOLN
100.0000 mg | Freq: Every day | INTRAVENOUS | Status: DC
  Administered 2024-03-09: 100 mg via INTRAVENOUS
  Filled 2024-03-08: qty 20

## 2024-03-08 MED ORDER — ALBUTEROL (5 MG/ML) CONTINUOUS INHALATION SOLN
10.0000 mg/h | INHALATION_SOLUTION | RESPIRATORY_TRACT | Status: AC
  Administered 2024-03-08: 10 mg/h via RESPIRATORY_TRACT
  Filled 2024-03-08: qty 20

## 2024-03-08 MED ORDER — ADULT MULTIVITAMIN W/MINERALS CH
1.0000 | ORAL_TABLET | Freq: Every day | ORAL | Status: DC
  Administered 2024-03-08 – 2024-03-09 (×2): 1 via ORAL
  Filled 2024-03-08 (×2): qty 1

## 2024-03-08 MED ORDER — ENOXAPARIN SODIUM 40 MG/0.4ML IJ SOSY
40.0000 mg | PREFILLED_SYRINGE | INTRAMUSCULAR | Status: DC
  Administered 2024-03-08: 40 mg via SUBCUTANEOUS
  Filled 2024-03-08: qty 0.4

## 2024-03-08 MED ORDER — SODIUM CHLORIDE 0.9 % IV SOLN
200.0000 mg | Freq: Once | INTRAVENOUS | Status: AC
  Administered 2024-03-08: 200 mg via INTRAVENOUS
  Filled 2024-03-08: qty 40

## 2024-03-08 MED ORDER — IPRATROPIUM-ALBUTEROL 0.5-2.5 (3) MG/3ML IN SOLN: 6.0000 mL | Freq: Once | RESPIRATORY_TRACT | Status: AC

## 2024-03-08 MED ORDER — OXYCODONE HCL 5 MG PO TABS
5.0000 mg | ORAL_TABLET | ORAL | Status: DC | PRN
  Administered 2024-03-08: 5 mg via ORAL
  Filled 2024-03-08: qty 1

## 2024-03-08 MED ORDER — PANTOPRAZOLE SODIUM 40 MG IV SOLR
40.0000 mg | Freq: Two times a day (BID) | INTRAVENOUS | Status: DC
  Administered 2024-03-08 – 2024-03-09 (×3): 40 mg via INTRAVENOUS
  Filled 2024-03-08 (×3): qty 10

## 2024-03-08 MED ORDER — FOLIC ACID 1 MG PO TABS
1.0000 mg | ORAL_TABLET | Freq: Every day | ORAL | Status: DC
  Administered 2024-03-08 – 2024-03-09 (×2): 1 mg via ORAL
  Filled 2024-03-08 (×2): qty 1

## 2024-03-08 MED ORDER — DEXAMETHASONE SOD PHOSPHATE PF 10 MG/ML IJ SOLN
6.0000 mg | INTRAMUSCULAR | Status: DC
  Administered 2024-03-08 – 2024-03-09 (×2): 6 mg via INTRAVENOUS
  Filled 2024-03-08 (×2): qty 1

## 2024-03-08 MED ORDER — IPRATROPIUM-ALBUTEROL 0.5-2.5 (3) MG/3ML IN SOLN
3.0000 mL | Freq: Four times a day (QID) | RESPIRATORY_TRACT | Status: DC | PRN
  Administered 2024-03-09 (×2): 3 mL via RESPIRATORY_TRACT
  Filled 2024-03-08: qty 3

## 2024-03-08 MED ORDER — ALUM & MAG HYDROXIDE-SIMETH 200-200-20 MG/5ML PO SUSP: 30.0000 mL | Freq: Four times a day (QID) | ORAL | Status: DC | PRN

## 2024-03-08 MED ORDER — POLYETHYLENE GLYCOL 3350 17 G PO PACK: 17.0000 g | PACK | Freq: Every day | ORAL | Status: DC | PRN

## 2024-03-08 MED ORDER — ALBUTEROL SULFATE (2.5 MG/3ML) 0.083% IN NEBU
2.5000 mg | INHALATION_SOLUTION | RESPIRATORY_TRACT | Status: AC
  Administered 2024-03-08 (×3): 2.5 mg via RESPIRATORY_TRACT
  Filled 2024-03-08 (×3): qty 3

## 2024-03-08 MED ORDER — ONDANSETRON HCL 4 MG/2ML IJ SOLN: 4.0000 mg | Freq: Four times a day (QID) | INTRAMUSCULAR | Status: DC | PRN

## 2024-03-08 MED ORDER — METHYLPREDNISOLONE SODIUM SUCC 125 MG IJ SOLR
125.0000 mg | INTRAMUSCULAR | Status: AC
  Administered 2024-03-08: 125 mg via INTRAVENOUS
  Filled 2024-03-08: qty 2

## 2024-03-08 MED ORDER — AMLODIPINE BESYLATE 5 MG PO TABS
10.0000 mg | ORAL_TABLET | Freq: Every day | ORAL | Status: DC
  Administered 2024-03-09: 10 mg via ORAL
  Filled 2024-03-08: qty 2

## 2024-03-08 MED ORDER — THIAMINE MONONITRATE 100 MG PO TABS
100.0000 mg | ORAL_TABLET | Freq: Every day | ORAL | Status: DC
  Administered 2024-03-08 – 2024-03-09 (×2): 100 mg via ORAL
  Filled 2024-03-08 (×2): qty 1

## 2024-03-08 MED ORDER — MAGNESIUM SULFATE 2 GM/50ML IV SOLN
2.0000 g | Freq: Once | INTRAVENOUS | Status: AC
  Administered 2024-03-08: 2 g via INTRAVENOUS
  Filled 2024-03-08: qty 50

## 2024-03-08 MED ORDER — ACETAMINOPHEN 325 MG RE SUPP: 650.0000 mg | Freq: Four times a day (QID) | RECTAL | Status: DC | PRN

## 2024-03-08 MED ORDER — MORPHINE SULFATE (PF) 2 MG/ML IV SOLN: 2.0000 mg | INTRAVENOUS | Status: DC | PRN

## 2024-03-08 MED ORDER — HYDRALAZINE HCL 20 MG/ML IJ SOLN: 10.0000 mg | Freq: Four times a day (QID) | INTRAMUSCULAR | Status: DC | PRN

## 2024-03-08 MED ORDER — IPRATROPIUM-ALBUTEROL 0.5-2.5 (3) MG/3ML IN SOLN
RESPIRATORY_TRACT | Status: AC
  Administered 2024-03-08: 6 mL via RESPIRATORY_TRACT
  Filled 2024-03-08: qty 6

## 2024-03-08 MED ORDER — ONDANSETRON HCL 4 MG PO TABS: 4.0000 mg | ORAL_TABLET | Freq: Four times a day (QID) | ORAL | Status: DC | PRN

## 2024-03-08 MED ORDER — ACETAMINOPHEN 325 MG PO TABS: 650.0000 mg | ORAL_TABLET | Freq: Four times a day (QID) | ORAL | Status: DC | PRN

## 2024-03-08 NOTE — ED Provider Notes (Signed)
 "  [facility] Provider Note    Event Date/Time   First MD Initiated Contact with Patient 03/08/24 0404     (approximate)   History   Shortness of Breath  EM caveat: Acuity of condition.  Severe respiratory  HPI  Thomas Gonzales is a 54 y.o. male reports since this morning having cough slight congestion no fevers chills or bodyaches.  Started wheezing.  Has been using albuterol  since about 2 PM at work.  Increasing wheezing and shortness of breath with a mostly dry slightly productive cough.  Worsening symptoms leading him to come to the emergency room.  Reports moderate feeling of a tightening feeling in the chest but not really painful.  Has had similar in the past with asthma  Reports he has never been intubated due to asthma.  No fevers no abdominal pain.  No leg swelling.  Took his blood pressure medicine today and was using albuterol  inhaler without relief  Past Medical History:  Diagnosis Date   Hypertension      Physical Exam   Triage Vital Signs: ED Triage Vitals  Encounter Vitals Group     BP 03/08/24 0401 (!) 180/81     Girls Systolic BP Percentile --      Girls Diastolic BP Percentile --      Boys Systolic BP Percentile --      Boys Diastolic BP Percentile --      Pulse Rate 03/08/24 0401 91     Resp 03/08/24 0401 (!) 25     Temp --      Temp src --      SpO2 03/08/24 0401 (!) 84 %     Weight 03/08/24 0358 185 lb (83.9 kg)     Height 03/08/24 0358 5' 10 (1.778 m)     Head Circumference --      Peak Flow --      Pain Score 03/08/24 0358 5     Pain Loc --      Pain Education --      Exclude from Growth Chart --     Most recent vital signs: Vitals:   03/08/24 0407 03/08/24 0511  BP:  (!) 157/77  Pulse:  92  Resp:  (!) 24  SpO2: 90% 96%     General: Awake, not tripoding but in moderate distress accessory muscle use speaks in phrases.  Currently receiving DuoNeb. CV:   Good peripheral perfusion. Normal rate and heart  tones. Resp:  Moderate extremis.  No tripoding and no weakness.  Fully alert.  Significant expiratory wheezing in all fields with slight central rhonchi.  Abd:   No distention. Soft, non-tender to palpation in all quadrants. No rebound or guarding. Neuro:   No focal neuro deficits noted. Moves extremities well without noted concern. Other:  No edema in the lower extremities bilateral.  No calf tenderness bilateral   ED Results / Procedures / Treatments   Labs (all labs ordered are listed, but only abnormal results are displayed) Labs Reviewed  RESP PANEL BY RT-PCR (RSV, FLU A&B, COVID)  RVPGX2 - Abnormal; Notable for the following components:      Result Value   SARS Coronavirus 2 by RT PCR POSITIVE (*)    All other components within normal limits  BASIC METABOLIC PANEL WITH GFR - Abnormal; Notable for the following components:   Glucose, Bld 111 (*)    All other components within normal limits  CBC  TROPONIN T, HIGH SENSITIVITY  TROPONIN T,  HIGH SENSITIVITY     EKG  I independently reviewed the EKG at 410 heart rate 90 QRS 90 QTc 430    RADIOLOGY I independently reviewed images of chest x-ray, inter by me as hyperinflated with otherwise clear lung fields  DG Chest Portable 1 View Result Date: 03/08/2024 EXAM: 1 VIEW(S) XRAY OF THE CHEST 03/08/2024 04:13:00 AM COMPARISON: 05/04/2013. CLINICAL HISTORY: Hypoxia. FINDINGS: LUNGS AND PLEURA: No focal pulmonary opacity. No pleural effusion. No pneumothorax. HEART AND MEDIASTINUM: No acute abnormality of the cardiac and mediastinal silhouettes. BONES AND SOFT TISSUES: No acute osseous abnormality. IMPRESSION: 1. No acute process. Electronically signed by: Waddell Calk MD 03/08/2024 05:07 AM EST RP Workstation: [id number]       PROCEDURES:  Critical Care performed: Yes, see critical care procedure note(s)  Procedures CRITICAL CARE Performed by: Oneil Budge   Total critical care time: 40 minutes  Critical care time was  exclusive of separately billable procedures and treating other patients.  Critical care was necessary to treat or prevent imminent or life-threatening deterioration.  Critical care was time spent personally by me on the following activities: development of treatment plan with patient and/or surrogate as well as nursing, discussions with consultants, evaluation of patient's response to treatment, examination of patient, obtaining history from patient or surrogate, ordering and performing treatments and interventions, ordering and review of laboratory studies, ordering and review of radiographic studies, pulse oximetry and re-evaluation of patient's condition.   MEDICATIONS ORDERED IN ED: Medications  albuterol  (PROVENTIL ) (2.5 MG/3ML) 0.083% nebulizer solution 2.5 mg (2.5 mg Nebulization Given 03/08/24 0533)  methylPREDNISolone  sodium succinate (SOLU-MEDROL ) 125 mg/2 mL injection 125 mg (125 mg Intravenous Given 03/08/24 0424)  magnesium  sulfate IVPB 2 g 50 mL (0 g Intravenous Stopped 03/08/24 0519)  albuterol  (PROVENTIL ,VENTOLIN ) solution continuous neb (10 mg/hr Nebulization Given 03/08/24 0433)  ipratropium-albuterol  (DUONEB) 0.5-2.5 (3) MG/3ML nebulizer solution 6 mL (6 mLs Nebulization Given 03/08/24 0426)     IMPRESSION / MDM / ASSESSMENT AND PLAN / ED COURSE  I reviewed the triage vital signs and the nursing notes.                              Based on presentation, the differential diagnosis includes, but is not limited to key considerations: shortness of breath DIFFERENTIAL DIAGNOSIS CONSIDERATIONS: High-acuity etiologies considered and prioritized for rule-out: - Pulmonary Embolism - Acute Decompensated Heart Failure - COPD / Asthma Exacerbation - Tension Pneumothorax - Pneumonia with Sepsis - Upper Airway Obstruction / Anaphylaxis - Acute Coronary Syndrome (Anginal Equivalent) - Metabolic Acidosis (DKA/Sepsis)  No frank findings of obvious infection.  Denies obvious infectious  symptoms and has a history of severe asthma with notable wheezing.  Clinical exam is most consistent with reactive airway disease but we will certainly evaluate for infectious causes viral cause etc.  We will start him aggressively on continuous albuterol  plus DuoNebs, steroids, magnesium , and careful monitoring.  He presents with moderately increased work of breathing and hypoxia concerning red flag.  Asthmatic.  Will continue to watch him very carefully hopefully with good improvement if he shows worsening status, fatigue etc. then the next step would be to move to mechanical ventilation.  No frank cardiac symptoms EKG reassuring.  Reports history of same in the past with asthma.  No findings or history highly suggestive of PE.  Especially given his lung examination etc.  This is not an exhaustive list.   Patient's presentation is most consistent with  acute presentation with potential threat to life or bodily function.    The patient is on the cardiac monitor to evaluate for evidence of arrhythmia and/or significant heart rate changes.  Clinical Course as of 03/08/24 0542  Wed Mar 08, 2024  0406 EKG inter by me at 405 heart rate 90 QRS 90 QTc 430.  Normal sinus rhythm.  No evidence of acute ischemia noted.  Incomplete right bundle branch block appearance.  Slightly prominent P waves suggestive of atrial enlargement.  No frank ischemic finding [MQ]  0523 Patient's work of breathing improving.  Finishing continuous albuterol .  Mild wheezing work of breathing improving but still slightly tachypneic.  Given his presentation and severity, status asthmaticus will admit for further care and treatment.  COVID-positive.  Discussed with patient and patient agreeable to proceed with a COVID-specific therapy [MQ]  [phone] Discussed with Pharmacist. Currently no longer keeping paxlovid regular in hospital, but will check.  [MQ]  0539 No paxlovid or mulpanivir in hospital. Consider remdesivir . Will defer to  hospitalist on if covid specific therapy to be initated.  [MQ]    Clinical Course User Index [MQ] Dicky Anes, MD   ----------------------------------------- 5:42 AM on 03/08/2024 ----------------------------------------- Patient agreeable with hospitalization.  Consulted with accepted to hospital service under care of Dr. Lawence -   FINAL CLINICAL IMPRESSION(S) / ED DIAGNOSES   Final diagnoses:  SARS-CoV-2 positive  Moderate persistent asthma with status asthmaticus     Rx / DC Orders   ED Discharge Orders     None        Note:  This document was prepared using Dragon voice recognition software and may include unintentional dictation errors.   Dicky Anes, MD 03/08/24 [phone]  "

## 2024-03-08 NOTE — ED Triage Notes (Signed)
 Pt reports sob that began earlier tonight, pt reports he has had cough and congestion as well. Pt has hx asthma. Pt reports some chest discomfort.

## 2024-03-08 NOTE — H&P (Signed)
 "  History and Physical    Thomas Gonzales FMW:[id number] DOB: 15-Oct-1970 DOA: 03/08/2024  DOS: the patient was seen and examined on 03/08/2024  PCP: Vicci Mliss SAUNDERS, FNP   Patient coming from: Home  I have personally briefly reviewed patient's old medical records in [facility]  Chief Complaint: Cough and shortness of breath for 2 days  HPI: Thomas Gonzales is a pleasant 54 y.o. male with medical history significant for chronic persistent asthma, HTN, chronic smoking who came into ED complaining of cough and shortness of breath since yesterday morning.  Patient was at work and was using albuterol  but did not get better.  He had a increasing wheezing and shortness of breath and cough with productive sputum.  There was no blood.  Due to his worsening symptoms of cough and shortness of breath patient came into the emergency room for evaluation.  Patient denies any fever, chest pain, palpitations, dysuria, hematuria.  ED Course: Upon arrival to the ED, patient is found to be hypertensive at 180/81, tachycardic at 91, tachypneic at 25, saturating 84% on room air.  Patient was positive for COVID, chest x-ray showed clear lung fields.  Patient was started on nebulization and steroid.  Hospitalist service was consulted for evaluation for admission for asthma exacerbation in the setting of COVID-19 infection.  Review of Systems:  ROS  All other systems negative except as noted in the HPI.  Past Medical History:  Diagnosis Date   Hypertension     History reviewed. No pertinent surgical history.   reports that he has been smoking. He has never used smokeless tobacco. He reports that he does not drink alcohol. No history on file for drug use.  Allergies[1]  History reviewed. No pertinent family history.  Prior to Admission medications  Medication Sig Start Date End Date Taking? Authorizing Provider  Albuterol  Sulfate (PROAIR  RESPICLICK) 108 (90 Base) MCG/ACT AEPB Inhale 2 puffs into the lungs  every 2 (two) hours as needed. 2 puffs Q 2-4 hours as needed   Yes [provider]  amLODipine  (NORVASC ) 10 MG tablet Take 10 mg by mouth daily. 02/19/24  Yes [provider]  omeprazole (PRILOSEC) 40 MG capsule Take 40 mg by mouth daily.   Yes [provider]    Physical Exam: Vitals:   03/08/24 0630 03/08/24 0700 03/08/24 0741 03/08/24 1000  BP: (!) 151/96 (!) 144/73  (!) 149/63  Pulse: 97 99  95  Resp: 20 (!) 22  (!) 25  Temp:   99 F (37.2 C)   TempSrc:   Oral   SpO2: 97% 97%  97%  Weight:      Height:        Physical Exam   Constitutional: Alert, awake, calm, comfortable HEENT: Neck supple Respiratory: Bilateral decreased air entry at the bases, wheezing present on both lung fields, no rhonchi no rales. Cardiovascular: Regular rate and rhythm, no murmurs / rubs / gallops. No extremity edema. 2+ pedal pulses. No carotid bruits.  Abdomen: Soft, no tenderness, Bowel sounds positive.  Musculoskeletal: no clubbing / cyanosis. Good ROM, no contractures. Normal muscle tone.  Skin: no rashes, lesions, ulcers. Neurologic: CN 2-12 grossly intact. Sensation intact, No focal deficit identified Psychiatric: Alert and oriented x 3. Normal mood.    Labs on Admission: I have personally reviewed following labs and imaging studies  CBC: Recent Labs  Lab 03/08/24 0409  WBC 8.0  HGB 14.4  HCT 42.1  MCV 92.1  PLT 325   Basic  Metabolic Panel: Recent Labs  Lab 03/08/24 0409  NA 139  K 3.8  CL 103  CO2 23  GLUCOSE 111*  BUN 15  CREATININE 0.84  CALCIUM 9.1   GFR: Estimated Creatinine Clearance: 103.8 mL/min (by C-G formula based on SCr of 0.84 mg/dL). Liver Function Tests: No results for input(s): AST, ALT, ALKPHOS, BILITOT, PROT, ALBUMIN in the last 168 hours. No results for input(s): LIPASE, AMYLASE in the last 168 hours. No results for input(s): AMMONIA in the last 168 hours. Coagulation Profile: No results for input(s):  INR, PROTIME in the last 168 hours. Cardiac Enzymes: No results for input(s): CKTOTAL, CKMB, CKMBINDEX, TROPONINI, TROPONINIHS in the last 168 hours. BNP (last 3 results) No results for input(s): BNP in the last 8760 hours. HbA1C: No results for input(s): HGBA1C in the last 72 hours. CBG: No results for input(s): GLUCAP in the last 168 hours. Lipid Profile: No results for input(s): CHOL, HDL, LDLCALC, TRIG, CHOLHDL, LDLDIRECT in the last 72 hours. Thyroid Function Tests: No results for input(s): TSH, T4TOTAL, FREET4, T3FREE, THYROIDAB in the last 72 hours. Anemia Panel: No results for input(s): VITAMINB12, FOLATE, FERRITIN, TIBC, IRON, RETICCTPCT in the last 72 hours. Urine analysis:    Component Value Date/Time   COLORURINE Yellow 05/02/2013 2141   APPEARANCEUR Clear 05/02/2013 2141   LABSPEC 1.027 05/02/2013 2141   PHURINE 5.0 05/02/2013 2141   GLUCOSEU Negative 05/02/2013 2141   HGBUR 2+ 05/02/2013 2141   BILIRUBINUR Negative 05/02/2013 2141   KETONESUR Trace 05/02/2013 2141   PROTEINUR 100 mg/dL 96/75/7984 7858   NITRITE Negative 05/02/2013 2141   LEUKOCYTESUR Negative 05/02/2013 2141    Radiological Exams on Admission: I have personally reviewed images DG Chest Portable 1 View Result Date: 03/08/2024 EXAM: 1 VIEW(S) XRAY OF THE CHEST 03/08/2024 04:13:00 AM COMPARISON: 05/04/2013. CLINICAL HISTORY: Hypoxia. FINDINGS: LUNGS AND PLEURA: No focal pulmonary opacity. No pleural effusion. No pneumothorax. HEART AND MEDIASTINUM: No acute abnormality of the cardiac and mediastinal silhouettes. BONES AND SOFT TISSUES: No acute osseous abnormality. IMPRESSION: 1. No acute process. Electronically signed by: Waddell Calk MD 03/08/2024 05:07 AM EST RP Workstation: [id number]    EKG: My personal interpretation of EKG shows: Normal sinus rhythm at 88 bpm    Assessment/Plan Principal Problem:   Acute asthma exacerbation Active  Problems:   COVID-19 virus infection   Tobacco dependence    Assessment and Plan: 54 year old male with history of chronic persistent asthma, chronic smoking, HTN who came into ED complaining of cough and shortness of breath that started 2 days ago.  1.  Acute exacerbation of chronic persistent asthma - Patient has a positive COVID-19 infection - Patient is  chronic active smoker - Patient will be placed in observation in progressive care with - Continue oxygen to maintain saturation more than 90%. - Continue nebulization, IV steroid  2.  COVID-19 infection - Patient will be placed on remdesivir  and IV steroid. - Continue oxygen to maintain saturation more than 90% - Currently there is no signs of pneumonia. - Will continue to monitor his symptoms.  3.  HTN - Patient to continue amlodipine  10 mg at home dose. - Continue to monitor blood pressure - Will place him on hydralazine  as needed for systolic blood pressure more than 160  4.  GERD - Patient will be placed on Protonix .  Patient is also on a steroid.  Will give IV Protonix .    DVT prophylaxis: Lovenox  Code Status: Full Code Family Communication: None Disposition Plan: Home Consults  called: None Admission status: Observation, progressive   Nena Rebel, MD [facility] 03/08/2024, 10:50 AM        [1] No Known Allergies  "

## 2024-03-08 NOTE — ED Notes (Signed)
 Pt placed on 3L NC

## 2024-03-09 DIAGNOSIS — J4542 Moderate persistent asthma with status asthmaticus: Secondary | ICD-10-CM

## 2024-03-09 DIAGNOSIS — U071 COVID-19: Principal | ICD-10-CM | POA: Diagnosis present

## 2024-03-09 LAB — COMPREHENSIVE METABOLIC PANEL WITH GFR
ALT: 11 U/L (ref 0–44)
AST: 18 U/L (ref 15–41)
Albumin: 4.3 g/dL (ref 3.5–5.0)
Alkaline Phosphatase: 122 U/L (ref 38–126)
Anion gap: 11 (ref 5–15)
BUN: 14 mg/dL (ref 6–20)
CO2: 25 mmol/L (ref 22–32)
Calcium: 9.1 mg/dL (ref 8.9–10.3)
Chloride: 101 mmol/L (ref 98–111)
Creatinine, Ser: 0.9 mg/dL (ref 0.61–1.24)
GFR, Estimated: >60 mL/min
Glucose, Bld: 121 mg/dL — ABNORMAL HIGH (ref 70–99)
Potassium: 5 mmol/L (ref 3.5–5.1)
Sodium: 137 mmol/L (ref 135–145)
Total Bilirubin: <0.2 mg/dL (ref 0.0–1.2)
Total Protein: 6.9 g/dL (ref 6.5–8.1)

## 2024-03-09 LAB — CBC
HCT: 39.6 % (ref 39.0–52.0)
Hemoglobin: 13.7 g/dL (ref 13.0–17.0)
MCH: 32.1 pg (ref 26.0–34.0)
MCHC: 34.6 g/dL (ref 30.0–36.0)
MCV: 92.7 fL (ref 80.0–100.0)
Platelets: 283 10*3/uL (ref 150–400)
RBC: 4.27 MIL/uL (ref 4.22–5.81)
RDW: 14.4 % (ref 11.5–15.5)
WBC: 10.1 10*3/uL (ref 4.0–10.5)
nRBC: 0 % (ref 0.0–0.2)

## 2024-03-09 MED ORDER — GUAIFENESIN ER 600 MG PO TB12: 600.0000 mg | ORAL_TABLET | Freq: Two times a day (BID) | ORAL | 0 refills | Status: AC | PRN

## 2024-03-09 MED ORDER — PROAIR RESPICLICK 108 (90 BASE) MCG/ACT IN AEPB: 2.0000 | INHALATION_SPRAY | RESPIRATORY_TRACT | 0 refills | Status: AC | PRN

## 2024-03-09 MED ORDER — PSEUDOEPHEDRINE HCL 30 MG PO TABS: 30.0000 mg | ORAL_TABLET | Freq: Three times a day (TID) | ORAL | 0 refills | Status: AC | PRN

## 2024-03-09 NOTE — ED Notes (Signed)
 Admission MD at bedside.

## 2024-03-09 NOTE — ED Notes (Signed)
 This tech and tech [place] completed a walking stat where the patient was stating at 96% before the walk and during the walk patient would drop to 92% and climbed back up to 94%. After the walk while the patient sat down patient stated to 94%.

## 2024-03-09 NOTE — Progress Notes (Incomplete)
 " PROGRESS NOTE Thomas Gonzales    DOB: [birth year], 54 y.o.  FMW:[id number]    Code Status: Full Code   DOA: 03/08/2024   LOS: 1  Brief hospital course  Thomas Gonzales is a 54 y.o. male with a PMH significant for chronic persistent asthma, HTN, chronic smoking who came into ED complaining of cough and shortness of breath since yesterday morning.  Patient was at work and was using albuterol  but did not get better.  He had a increasing wheezing and shortness of breath and cough with productive sputum.  There was no blood.  Due to his worsening symptoms of cough and shortness of breath patient came into the emergency room for evaluation.  Patient denies any fever, chest pain, palpitations, dysuria, hematuria.   ED Course: Upon arrival to the ED, patient is found to be hypertensive at 180/81, tachycardic at 91, tachypneic at 25, saturating 84% on room air.  Patient was positive for COVID, chest x-ray showed clear lung fields.  Patient was started on nebulization and steroid.  Hospitalist service was consulted for evaluation for admission for asthma exacerbation in the setting of COVID-19 infection.  They presented from *** to the ED on 03/08/2024 with *** x *** days. ***  In the ED, it was found that they had ***.  Significant findings included ***.  They were initially treated with ***.   Patient was admitted to medicine service for further workup and management of *** as outlined in detail below.  03/09/24 -***  Assessment & Plan  Principal Problem:   Acute asthma exacerbation Active Problems:   COVID-19 virus infection   Tobacco dependence  *** -   *** -   *** -   *** -   *** -   Body mass index is 26.54 kg/m.  VTE ppx: enoxaparin  (LOVENOX ) injection 40 mg Start: 03/08/24 2200 SCDs Start: 03/08/24 0745   Diet:     Diet   Diet regular Room service appropriate? Yes; Fluid consistency: Thin   Consultants: ***  Subjective 03/09/24    Pt reports ***   Objective  Blood  pressure 136/81, pulse 88, temperature 97.9 F (36.6 C), temperature source Oral, resp. rate (!) 21, height 5' 10 (1.778 m), weight 83.9 kg, SpO2 100%. No intake or output data in the 24 hours ending 03/09/24 0743 Filed Weights   03/08/24 0358  Weight: 83.9 kg     Physical Exam: *** General: awake, alert, NAD HEENT: atraumatic, clear conjunctiva, anicteric sclera, MMM, hearing grossly normal Respiratory: normal respiratory effort. Cardiovascular: extremities well perfused, quick capillary refill, normal S1/S2, RRR, no JVD, murmurs Gastrointestinal: soft, NT, ND Nervous: A&O x3. no gross focal neurologic deficits, normal speech Extremities: moves all equally, no edema, normal tone Skin: dry, intact, normal temperature, normal color. No rashes, lesions or ulcers on exposed skin Psychiatry: normal mood, congruent affect  Labs   I have personally reviewed the following labs and imaging studies CBC    Component Value Date/Time   WBC 10.1 03/09/2024 0413   RBC 4.27 03/09/2024 0413   HGB 13.7 03/09/2024 0413   HGB 14.7 08/10/2013 0023   HCT 39.6 03/09/2024 0413   HCT 43.9 08/10/2013 0023   PLT 283 03/09/2024 0413   PLT 416 08/10/2013 0023   MCV 92.7 03/09/2024 0413   MCV 93 08/10/2013 0023   MCH 32.1 03/09/2024 0413   MCHC 34.6 03/09/2024 0413   RDW 14.4 03/09/2024 0413   RDW 14.3 08/10/2013 0023   LYMPHSABS 1.3 08/10/2013 0023  MONOABS 1.7 (H) 08/10/2013 0023   EOSABS 0.1 08/10/2013 0023   BASOSABS 0.1 08/10/2013 0023      Latest Ref Rng & Units 03/09/2024    4:13 AM 03/08/2024    4:09 AM 08/10/2013   12:23 AM  BMP  Glucose 70 - 99 mg/dL 878  888  98   BUN 6 - 20 mg/dL 14  15  7    Creatinine 0.61 - 1.24 mg/dL 9.09  9.15  8.96   Sodium 135 - 145 mmol/L 137  139  134   Potassium 3.5 - 5.1 mmol/L 5.0  3.8  3.4   Chloride 98 - 111 mmol/L 101  103  99   CO2 22 - 32 mmol/L 25  23  28    Calcium 8.9 - 10.3 mg/dL 9.1  9.1  9.7     DG Chest Portable 1 View Result Date:  03/08/2024 EXAM: 1 VIEW(S) XRAY OF THE CHEST 03/08/2024 04:13:00 AM COMPARISON: 05/04/2013. CLINICAL HISTORY: Hypoxia. FINDINGS: LUNGS AND PLEURA: No focal pulmonary opacity. No pleural effusion. No pneumothorax. HEART AND MEDIASTINUM: No acute abnormality of the cardiac and mediastinal silhouettes. BONES AND SOFT TISSUES: No acute osseous abnormality. IMPRESSION: 1. No acute process. Electronically signed by: Waddell Calk MD 03/08/2024 05:07 AM EST RP Workstation: [id number]    Disposition Plan & Communication  Patient status: Inpatient  Admitted From: {From:23814} Planned disposition location: {PLAN; DISPOSITION:26386} Anticipated discharge date: *** pending ***  Family Communication: ***    Author: Marien LITTIE Piety, DO [facility] 03/09/2024, 7:43 AM   Available by Epic secure chat 7AM-7PM. If 7PM-7AM, please contact night-coverage.  [facility] contact information found on [email or web address].  "

## 2024-03-09 NOTE — Discharge Instructions (Signed)
 Please continue to take your albuterol  as needed for wheezing and shortness of breath.  I recommend using medications such as mucinex  and sudafed which can reduce the congestion from your COVID infection.

## 2024-03-09 NOTE — ED Notes (Signed)
"  Pt weaned to RA  "

## 2024-03-09 NOTE — Progress Notes (Signed)
" ° °  Brief Progress Note   _____________________________________________________________________________________________________________  Patient Name: Thomas Gonzales Patient DOB: [birth year] Date: @TODAY @      Data: 54 yo male currently awaiting admission to a Progressive bed at [facility].    Action: Reached out to Dr. Lenon regarding the potential downgrade of the patient to a telemetry bed.     Response:  Dr. Lenon will update patients level of care to reflect Telemetry status. Per Dr. Lenon possibility patient may be discharged home later today.  _____________________________________________________________________________________________________________  [facility] RN Expeditor Esabella Stockinger S Adelyna Brockman Please contact us  directly via secure chat (search for [facility]) or by calling us  at [phone] [facility]).  "

## 2024-03-10 LAB — MISC LABCORP TEST (SEND OUT): Labcorp test code: 83935
